# Patient Record
Sex: Male | Born: 1990
Health system: Southern US, Community
[De-identification: ages and names within clinical notes are randomized; demographics above are authoritative.]

## PROBLEM LIST (undated history)

## (undated) DIAGNOSIS — E7601 Hurler's syndrome: Secondary | ICD-10-CM

## (undated) DIAGNOSIS — R6250 Unspecified lack of expected normal physiological development in childhood: Secondary | ICD-10-CM

## (undated) HISTORY — PX: MASS EXCISION: SHX2000

## (undated) HISTORY — PX: APPENDECTOMY: SHX54

## (undated) HISTORY — PX: ADENOIDECTOMY: SUR15

## (undated) HISTORY — PX: TONSILLECTOMY: SUR1361

## (undated) HISTORY — PX: TYMPANOSTOMY TUBE PLACEMENT: SHX32

---
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
## 2012-01-10 ENCOUNTER — Emergency Department (HOSPITAL_BASED_OUTPATIENT_CLINIC_OR_DEPARTMENT_OTHER)
Admission: EM | Admit: 2012-01-10 | Discharge: 2012-01-10 | Disposition: A | Discharge status: Other Acute Inpatient Hospital | Payer: Medicaid Other | Attending: Emergency Medicine | Admitting: Emergency Medicine

## 2012-01-10 ENCOUNTER — Encounter (HOSPITAL_BASED_OUTPATIENT_CLINIC_OR_DEPARTMENT_OTHER): Payer: Self-pay

## 2012-01-10 ENCOUNTER — Emergency Department (HOSPITAL_BASED_OUTPATIENT_CLINIC_OR_DEPARTMENT_OTHER): Payer: Medicaid Other

## 2012-01-10 DIAGNOSIS — J45909 Unspecified asthma, uncomplicated: Secondary | ICD-10-CM | POA: Insufficient documentation

## 2012-01-10 DIAGNOSIS — J189 Pneumonia, unspecified organism: Secondary | ICD-10-CM | POA: Insufficient documentation

## 2012-01-10 DIAGNOSIS — Z9089 Acquired absence of other organs: Secondary | ICD-10-CM | POA: Insufficient documentation

## 2012-01-10 LAB — URINALYSIS, ROUTINE W REFLEX MICROSCOPIC
Glucose, UA: NEGATIVE mg/dL
Leukocytes, UA: NEGATIVE
Nitrite: NEGATIVE
Specific Gravity, Urine: 1.034 — ABNORMAL HIGH (ref 1.005–1.030)
Urobilinogen, UA: 0.2 mg/dL (ref 0.0–1.0)
pH: 5.5 (ref 5.0–8.0)

## 2012-01-10 LAB — CBC WITH DIFFERENTIAL/PLATELET
Basophils Relative: 0 % (ref 0–1)
Eosinophils Absolute: 0.3 10*3/uL (ref 0.0–0.7)
Hemoglobin: 16.3 g/dL (ref 13.0–17.0)
Lymphs Abs: 2.1 10*3/uL (ref 0.7–4.0)
MCH: 27.9 pg (ref 26.0–34.0)
MCHC: 34.9 g/dL (ref 30.0–36.0)
MCV: 80 fL (ref 78.0–100.0)
Neutro Abs: 12.3 10*3/uL — ABNORMAL HIGH (ref 1.7–7.7)
Platelets: 227 10*3/uL (ref 150–400)
RDW: 14.1 % (ref 11.5–15.5)
WBC: 16.3 10*3/uL — ABNORMAL HIGH (ref 4.0–10.5)

## 2012-01-10 LAB — COMPREHENSIVE METABOLIC PANEL
ALT: 31 U/L (ref 0–53)
Albumin: 4.3 g/dL (ref 3.5–5.2)
BUN: 14 mg/dL (ref 6–23)
Calcium: 9.4 mg/dL (ref 8.4–10.5)
Creatinine, Ser: 0.9 mg/dL (ref 0.50–1.35)
Potassium: 3.6 mEq/L (ref 3.5–5.1)
Total Protein: 8.2 g/dL (ref 6.0–8.3)

## 2012-01-10 LAB — URINE MICROSCOPIC-ADD ON

## 2012-01-10 MED ORDER — SODIUM CHLORIDE 0.9 % IV BOLUS (SEPSIS)
500.0000 mL | Freq: Once | INTRAVENOUS | Status: AC
  Administered 2012-01-10: 500 mL via INTRAVENOUS

## 2012-01-10 MED ORDER — LEVOFLOXACIN IN D5W 500 MG/100ML IV SOLN
500.0000 mg | INTRAVENOUS | Status: DC
  Filled 2012-01-10: qty 100

## 2012-01-10 MED ORDER — MOXIFLOXACIN HCL IN NACL 400 MG/250ML IV SOLN
400.0000 mg | Freq: Once | INTRAVENOUS | Status: AC
  Administered 2012-01-10 (×2): 400 mg via INTRAVENOUS
  Filled 2012-01-10: qty 250

## 2012-01-10 NOTE — ED Notes (Signed)
Patient transported to X-ray 

## 2012-01-10 NOTE — ED Notes (Signed)
Call placed to [facility] hospitalist  Pt accepted by [facility]

## 2012-01-10 NOTE — ED Provider Notes (Signed)
History     CSN: [id number]  Arrival date & time 01/10/12  4132   First MD Initiated Contact with Patient 01/10/12 1913      Chief Complaint  Patient presents with  . Emesis  . Abdominal Pain    (Consider location/radiation/quality/duration/timing/severity/associated sxs/prior treatment) HPI Pt p/w 1 day of frontal HA, rhinorrhea and congestion, cough, central abdominal pain and vomiting x 4. Pt is poor historian due to DD and history is mostly from mother. No fever, chills, diarrhea.  Past Medical History  Diagnosis Date  . Asthma   . Hurler syndrome   . Hearing loss   . Developmental delay     Past Surgical History  Procedure Date  . Adenoidectomy   . Tonsillectomy   . Appendectomy   . Mass excision   . Tympanostomy tube placement     No family history on file.  History  Substance Use Topics  . Smoking status: Never Smoker   . Smokeless tobacco: Never Used  . Alcohol Use: No      Review of Systems  Constitutional: Negative for fever and chills.  HENT: Positive for congestion, rhinorrhea and sinus pressure. Negative for neck stiffness.   Respiratory: Positive for cough. Negative for shortness of breath and wheezing.   Cardiovascular: Negative for chest pain and leg swelling.  Gastrointestinal: Positive for nausea, vomiting and abdominal pain. Negative for diarrhea and constipation.  Neurological: Positive for headaches. Negative for weakness and numbness.    Allergies  Nsaids; Rocephin; and Codeine  Home Medications  No current outpatient prescriptions on file.  BP 126/72  Pulse 112  Temp 98.2 F (36.8 C) (Oral)  Resp 20  Wt 160 lb (72.576 kg)  SpO2 98%  Physical Exam  Nursing note and vitals reviewed. Constitutional: He appears well-developed and well-nourished. No distress.  HENT:  Head: Normocephalic and atraumatic.  Mouth/Throat: Oropharynx is clear and moist.       Mild TTP over frontal sinuses. + Nasal congestion  Eyes: EOM are  normal. Pupils are equal, round, and reactive to light.  Neck: Normal range of motion. Neck supple.  Cardiovascular: Normal rate and regular rhythm.   Pulmonary/Chest: Effort normal and breath sounds normal. No respiratory distress. He has no wheezes. He has no rales.  Abdominal: Soft. Bowel sounds are normal. He exhibits distension. There is no tenderness. There is no rebound and no guarding.  Musculoskeletal: Normal range of motion. He exhibits no edema and no tenderness.  Neurological: He is alert.       Move all ext without deficit, sensation grossly intact  Skin: Skin is warm and dry. No rash noted. No erythema.  Psychiatric: He has a normal mood and affect. His behavior is normal.    ED Course  Procedures (including critical care time)  Labs Reviewed  CBC WITH DIFFERENTIAL - Abnormal; Notable for the following:    WBC 16.3 (*)     RBC 5.84 (*)     Neutro Abs 12.3 (*)     Monocytes Absolute 1.7 (*)     All other components within normal limits  URINALYSIS, ROUTINE W REFLEX MICROSCOPIC - Abnormal; Notable for the following:    Specific Gravity, Urine 1.034 (*)     Hgb urine dipstick TRACE (*)     Bilirubin Urine SMALL (*)     Ketones, ur 15 (*)     All other components within normal limits  COMPREHENSIVE METABOLIC PANEL  URINE MICROSCOPIC-ADD ON  CULTURE, BLOOD (SINGLE)   Dg  Abd Acute W/chest  01/10/2012  *RADIOLOGY REPORT*  Clinical Data: Abdominal pain, cough and vomiting.  ACUTE ABDOMEN SERIES (ABDOMEN 2 VIEW & CHEST 1 VIEW)  Comparison: Chest x-ray dated 03/04/2010  Findings: Superimposed on some mild prominence of bronchi is additional opacity at both lung bases since the prior chest x-ray reflecting either atelectasis or potentially component of infiltrates.  No edema or pleural fluid.  Heart size is normal.  Abdominal films show no evidence of bowel obstruction or ileus.  No abnormal calcifications are seen.  There is a mild leftward convex scoliosis of the lumbar spine  and some compensatory curvature of the thoracic spine.  IMPRESSION: Bibasilar atelectasis versus infiltrates in the chest.  Original Report Authenticated By: Reola Calkins, M.D.     1. Community acquired pneumonia       MDM  Discussed with Dr Darleene Cleaver at [facility] per pt's mother's request. Will accept in tranfer        Loren Racer, MD 01/10/12 2117

## 2012-01-10 NOTE — ED Notes (Signed)
Vomited x 4 today-cough, sore throat

## 2012-01-10 NOTE — ED Notes (Addendum)
Pt c/o abd pain when asked pain location

## 2012-01-17 LAB — CULTURE, BLOOD (SINGLE): Culture: NO GROWTH

## 2012-12-12 ENCOUNTER — Encounter (HOSPITAL_BASED_OUTPATIENT_CLINIC_OR_DEPARTMENT_OTHER): Payer: Self-pay | Admitting: *Deleted

## 2012-12-12 ENCOUNTER — Emergency Department (HOSPITAL_BASED_OUTPATIENT_CLINIC_OR_DEPARTMENT_OTHER)
Admission: EM | Admit: 2012-12-12 | Discharge: 2012-12-12 | Disposition: A | Discharge status: Home / Self Care | Payer: Medicaid Other | Attending: Emergency Medicine | Admitting: Emergency Medicine

## 2012-12-12 DIAGNOSIS — J3489 Other specified disorders of nose and nasal sinuses: Secondary | ICD-10-CM | POA: Insufficient documentation

## 2012-12-12 DIAGNOSIS — J45909 Unspecified asthma, uncomplicated: Secondary | ICD-10-CM | POA: Insufficient documentation

## 2012-12-12 DIAGNOSIS — H9190 Unspecified hearing loss, unspecified ear: Secondary | ICD-10-CM | POA: Insufficient documentation

## 2012-12-12 DIAGNOSIS — J029 Acute pharyngitis, unspecified: Secondary | ICD-10-CM | POA: Insufficient documentation

## 2012-12-12 DIAGNOSIS — B349 Viral infection, unspecified: Secondary | ICD-10-CM

## 2012-12-12 DIAGNOSIS — R509 Fever, unspecified: Secondary | ICD-10-CM | POA: Insufficient documentation

## 2012-12-12 DIAGNOSIS — R6250 Unspecified lack of expected normal physiological development in childhood: Secondary | ICD-10-CM | POA: Insufficient documentation

## 2012-12-12 DIAGNOSIS — Z862 Personal history of diseases of the blood and blood-forming organs and certain disorders involving the immune mechanism: Secondary | ICD-10-CM | POA: Insufficient documentation

## 2012-12-12 DIAGNOSIS — Z8639 Personal history of other endocrine, nutritional and metabolic disease: Secondary | ICD-10-CM | POA: Insufficient documentation

## 2012-12-12 DIAGNOSIS — Z8659 Personal history of other mental and behavioral disorders: Secondary | ICD-10-CM | POA: Insufficient documentation

## 2012-12-12 DIAGNOSIS — B9789 Other viral agents as the cause of diseases classified elsewhere: Secondary | ICD-10-CM | POA: Insufficient documentation

## 2012-12-12 DIAGNOSIS — R112 Nausea with vomiting, unspecified: Secondary | ICD-10-CM | POA: Insufficient documentation

## 2012-12-12 MED ORDER — ONDANSETRON HCL 4 MG PO TABS
4.0000 mg | ORAL_TABLET | Freq: Four times a day (QID) | ORAL | Status: DC
Start: 2012-12-12 — End: 2013-09-16

## 2012-12-12 MED ORDER — ONDANSETRON 8 MG PO TBDP
8.0000 mg | ORAL_TABLET | Freq: Once | ORAL | Status: AC
  Administered 2012-12-12: 8 mg via ORAL
  Filled 2012-12-12 (×2): qty 1

## 2012-12-12 NOTE — ED Notes (Signed)
Fever sore throat has intermittent vomiting

## 2012-12-12 NOTE — ED Provider Notes (Signed)
History     CSN: [id number]  Arrival date & time 12/12/12  1814   First MD Initiated Contact with Patient 12/12/12 1820      No chief complaint on file.   (Consider location/radiation/quality/duration/timing/severity/associated sxs/prior treatment) HPI Comments: Patient presents to ER for evaluation of fever, sinus congestion, sore throat with vomiting which began earlier today. He denies abdominal pain. There has been no diarrhea. Patient is not coughing.   Past Medical History  Diagnosis Date  . Asthma   . Hurler syndrome   . Hearing loss   . Developmental delay     Past Surgical History  Procedure Laterality Date  . Adenoidectomy    . Tonsillectomy    . Appendectomy    . Mass excision    . Tympanostomy tube placement      No family history on file.  History  Substance Use Topics  . Smoking status: Never Smoker   . Smokeless tobacco: Never Used  . Alcohol Use: No      Review of Systems  Constitutional: Positive for fever.  HENT: Positive for congestion and sore throat.   Gastrointestinal: Positive for nausea and vomiting.  All other systems reviewed and are negative.    Allergies  Nsaids; Rocephin; and Codeine  Home Medications  No current outpatient prescriptions on file.  There were no vitals taken for this visit.  Physical Exam  Constitutional: He is oriented to person, place, and time. He appears well-developed and well-nourished. No distress.  HENT:  Head: Normocephalic and atraumatic.  Right Ear: Hearing normal.  Left Ear: Hearing normal.  Nose: Nose normal.  Mouth/Throat: Mucous membranes are normal. Posterior oropharyngeal erythema present. No tonsillar abscesses.  Eyes: Conjunctivae and EOM are normal. Pupils are equal, round, and reactive to light.  Neck: Normal range of motion. Neck supple.  Cardiovascular: Regular rhythm, S1 normal and S2 normal.  Exam reveals no gallop and no friction rub.   No murmur heard. Pulmonary/Chest:  Effort normal and breath sounds normal. No respiratory distress. He exhibits no tenderness.  Abdominal: Soft. Normal appearance and bowel sounds are normal. There is no hepatosplenomegaly. There is no tenderness. There is no rebound, no guarding, no tenderness at McBurney's point and negative Murphy's sign. No hernia.  Musculoskeletal: Normal range of motion.  Neurological: He is alert and oriented to person, place, and time. He has normal strength. No cranial nerve deficit or sensory deficit. Coordination normal. GCS eye subscore is 4. GCS verbal subscore is 5. GCS motor subscore is 6.  Skin: Skin is warm, dry and intact. No rash noted. No cyanosis.  Psychiatric: He has a normal mood and affect. His speech is normal and behavior is normal. Thought content normal.    ED Course  Procedures (including critical care time)  Labs Reviewed  RAPID STREP SCREEN   No results found.   Diagnosis: Viral syndrome    MDM  Patient presents with nasal congestion, sore throat. Mother reports fever, no fever here. He reportedly had vomiting earlier, no vomiting since she was given Zofran care. Tolerating by mouth. Strep negative, culture will be sent. Patient looks well, no abdominal pain. Abdominal exam is soft and nontender. Continue symptomatic treatment for any further vomiting, followup as needed.        Gilda Crease, MD 12/12/12 1943

## 2012-12-14 LAB — CULTURE, GROUP A STREP

## 2013-09-16 ENCOUNTER — Emergency Department (HOSPITAL_BASED_OUTPATIENT_CLINIC_OR_DEPARTMENT_OTHER)
Admission: EM | Admit: 2013-09-16 | Discharge: 2013-09-16 | Disposition: A | Discharge status: Home / Self Care | Payer: Medicaid Other | Attending: Emergency Medicine | Admitting: Emergency Medicine

## 2013-09-16 ENCOUNTER — Encounter (HOSPITAL_BASED_OUTPATIENT_CLINIC_OR_DEPARTMENT_OTHER): Payer: Self-pay | Admitting: Emergency Medicine

## 2013-09-16 ENCOUNTER — Emergency Department (HOSPITAL_BASED_OUTPATIENT_CLINIC_OR_DEPARTMENT_OTHER): Payer: Medicaid Other

## 2013-09-16 DIAGNOSIS — Z862 Personal history of diseases of the blood and blood-forming organs and certain disorders involving the immune mechanism: Secondary | ICD-10-CM | POA: Insufficient documentation

## 2013-09-16 DIAGNOSIS — R109 Unspecified abdominal pain: Secondary | ICD-10-CM

## 2013-09-16 DIAGNOSIS — Z8639 Personal history of other endocrine, nutritional and metabolic disease: Secondary | ICD-10-CM | POA: Insufficient documentation

## 2013-09-16 DIAGNOSIS — J45909 Unspecified asthma, uncomplicated: Secondary | ICD-10-CM | POA: Insufficient documentation

## 2013-09-16 DIAGNOSIS — R000 Tachycardia, unspecified: Secondary | ICD-10-CM | POA: Insufficient documentation

## 2013-09-16 DIAGNOSIS — R112 Nausea with vomiting, unspecified: Secondary | ICD-10-CM | POA: Insufficient documentation

## 2013-09-16 DIAGNOSIS — Z79899 Other long term (current) drug therapy: Secondary | ICD-10-CM | POA: Insufficient documentation

## 2013-09-16 DIAGNOSIS — Z9089 Acquired absence of other organs: Secondary | ICD-10-CM | POA: Insufficient documentation

## 2013-09-16 DIAGNOSIS — R197 Diarrhea, unspecified: Secondary | ICD-10-CM | POA: Insufficient documentation

## 2013-09-16 DIAGNOSIS — Z8669 Personal history of other diseases of the nervous system and sense organs: Secondary | ICD-10-CM | POA: Insufficient documentation

## 2013-09-16 DIAGNOSIS — R1084 Generalized abdominal pain: Secondary | ICD-10-CM | POA: Insufficient documentation

## 2013-09-16 LAB — CBC WITH DIFFERENTIAL/PLATELET
BASOS PCT: 0 % (ref 0–1)
Basophils Absolute: 0 10*3/uL (ref 0.0–0.1)
EOS ABS: 0.2 10*3/uL (ref 0.0–0.7)
Eosinophils Relative: 1 % (ref 0–5)
HCT: 54.3 % — ABNORMAL HIGH (ref 39.0–52.0)
HEMOGLOBIN: 19 g/dL — AB (ref 13.0–17.0)
LYMPHS ABS: 0.7 10*3/uL (ref 0.7–4.0)
LYMPHS PCT: 5 % — AB (ref 12–46)
MCH: 28.6 pg (ref 26.0–34.0)
MCHC: 35 g/dL (ref 30.0–36.0)
MCV: 81.8 fL (ref 78.0–100.0)
MONO ABS: 1.5 10*3/uL — AB (ref 0.1–1.0)
Monocytes Relative: 11 % (ref 3–12)
NEUTROS ABS: 11.8 10*3/uL — AB (ref 1.7–7.7)
NEUTROS PCT: 83 % — AB (ref 43–77)
Platelets: 223 10*3/uL (ref 150–400)
RBC: 6.64 MIL/uL — ABNORMAL HIGH (ref 4.22–5.81)
RDW: 14.8 % (ref 11.5–15.5)
WBC: 14.3 10*3/uL — AB (ref 4.0–10.5)

## 2013-09-16 LAB — COMPREHENSIVE METABOLIC PANEL
ALT: 38 U/L (ref 0–53)
AST: 25 U/L (ref 0–37)
Albumin: 4.6 g/dL (ref 3.5–5.2)
Alkaline Phosphatase: 80 U/L (ref 39–117)
BUN: 12 mg/dL (ref 6–23)
CO2: 23 mEq/L (ref 19–32)
Calcium: 9.5 mg/dL (ref 8.4–10.5)
Chloride: 104 mEq/L (ref 96–112)
Creatinine, Ser: 0.8 mg/dL (ref 0.50–1.35)
GFR calc Af Amer: >90 mL/min (ref >90)
GFR calc non Af Amer: >90 mL/min (ref >90)
Glucose, Bld: 99 mg/dL (ref 70–99)
Potassium: 4 mEq/L (ref 3.7–5.3)
Sodium: 142 mEq/L (ref 137–147)
Total Bilirubin: 0.8 mg/dL (ref 0.3–1.2)
Total Protein: 8.5 g/dL — ABNORMAL HIGH (ref 6.0–8.3)

## 2013-09-16 LAB — URINALYSIS, ROUTINE W REFLEX MICROSCOPIC
Bilirubin Urine: NEGATIVE
Glucose, UA: NEGATIVE mg/dL
KETONES UR: 15 mg/dL — AB
LEUKOCYTES UA: NEGATIVE
NITRITE: NEGATIVE
PH: 5 (ref 5.0–8.0)
PROTEIN: NEGATIVE mg/dL
Specific Gravity, Urine: 1.018 (ref 1.005–1.030)
Urobilinogen, UA: 0.2 mg/dL (ref 0.0–1.0)

## 2013-09-16 LAB — URINE MICROSCOPIC-ADD ON

## 2013-09-16 LAB — LIPASE, BLOOD: LIPASE: 26 U/L (ref 11–59)

## 2013-09-16 MED ORDER — ONDANSETRON 4 MG PO TBDP: 4.0000 mg | ORAL_TABLET | Freq: Three times a day (TID) | ORAL | Status: DC | PRN

## 2013-09-16 MED ORDER — ONDANSETRON HCL 4 MG/2ML IJ SOLN
INTRAMUSCULAR | Status: AC
  Filled 2013-09-16: qty 2

## 2013-09-16 MED ORDER — ONDANSETRON HCL 4 MG/2ML IJ SOLN
4.0000 mg | Freq: Once | INTRAMUSCULAR | Status: AC
  Administered 2013-09-16: 4 mg via INTRAVENOUS

## 2013-09-16 MED ORDER — FENTANYL CITRATE 0.05 MG/ML IJ SOLN
50.0000 ug | Freq: Once | INTRAMUSCULAR | Status: AC
Start: 2013-09-16 — End: 2013-09-16
  Administered 2013-09-16: 50 ug via INTRAVENOUS
  Filled 2013-09-16: qty 2

## 2013-09-16 MED ORDER — SODIUM CHLORIDE 0.9 % IV SOLN
1000.0000 mL | Freq: Once | INTRAVENOUS | Status: AC
  Administered 2013-09-16: 1000 mL via INTRAVENOUS

## 2013-09-16 MED ORDER — ONDANSETRON HCL 4 MG/2ML IJ SOLN
4.0000 mg | Freq: Once | INTRAMUSCULAR | Status: AC
  Administered 2013-09-16: 4 mg via INTRAVENOUS
  Filled 2013-09-16: qty 2

## 2013-09-16 NOTE — ED Notes (Signed)
Pt with abd pain, n/v/d today.  Denies fever.

## 2013-09-16 NOTE — Discharge Instructions (Signed)
As discussed, your evaluation today has been largely reassuring.  But, it is important that you monitor your condition carefully, and do not hesitate to return to the ED if you develop new, or concerning changes in your condition. ° °Otherwise, please follow-up with your physician for appropriate ongoing care. ° ° ° °Abdominal Pain, Adult °Many things can cause abdominal pain. Usually, abdominal pain is not caused by a disease and will improve without treatment. It can often be observed and treated at home. Your health care provider will do a physical exam and possibly order blood tests and X-rays to help determine the seriousness of your pain. However, in many cases, more time must pass before a clear cause of the pain can be found. Before that point, your health care provider may not know if you need more testing or further treatment. °HOME CARE INSTRUCTIONS  °Monitor your abdominal pain for any changes. The following actions may help to alleviate any discomfort you are experiencing: °· Only take over-the-counter or prescription medicines as directed by your health care provider. °· Do not take laxatives unless directed to do so by your health care provider. °· Try a clear liquid diet (broth, tea, or water) as directed by your health care provider. Slowly move to a bland diet as tolerated. °SEEK MEDICAL CARE IF: °· You have unexplained abdominal pain. °· You have abdominal pain associated with nausea or diarrhea. °· You have pain when you urinate or have a bowel movement. °· You experience abdominal pain that wakes you in the night. °· You have abdominal pain that is worsened or improved by eating food. °· You have abdominal pain that is worsened with eating fatty foods. °SEEK IMMEDIATE MEDICAL CARE IF:  °· Your pain does not go away within 2 hours. °· You have a fever. °· You keep throwing up (vomiting). °· Your pain is felt only in portions of the abdomen, such as the right side or the left lower portion of the  abdomen. °· You pass bloody or black tarry stools. °MAKE SURE YOU: °· Understand these instructions.   °· Will watch your condition.   °· Will get help right away if you are not doing well or get worse.   °Document Released: 03/23/2005 Document Revised: 04/03/2013 Document Reviewed: 02/20/2013 °[employer]® Patient Information ©2014 [employer]. ° °

## 2013-09-16 NOTE — ED Notes (Signed)
Pt vomited green liquid material on gown and sheets, pt cleaned up and md notified, pt states that it happened all of the sudden, he denies nausea at this time

## 2013-09-16 NOTE — ED Provider Notes (Signed)
CSN: [id number]     Arrival date & time 09/16/13  4098 History   This chart was scribed for Gerhard Munch, MD by Manuela Schwartz, ED scribe. This patient was seen in room MH03/MH03 and the patient's care was started at 1822.  Chief Complaint  Patient presents with  . Abdominal Pain   The history is provided by the patient. No language interpreter was used.   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 He felt fine last PM and all of yesterday. He also reports x2 episodes of diarrhea today. He tried taking dramamine w/minimal relief. He denies any fever, cough, diarrhea, skin rash or swelling. No hx kidney problems.  He had abdominal surgery in 2007 to remove a mass on his stomach and his appendix.  He is allergic to codeine and rocephin  Past Medical History  Diagnosis Date  . Asthma   . Hurler syndrome   . Hearing loss   . Developmental delay    Past Surgical History  Procedure Laterality Date  . Adenoidectomy    . Tonsillectomy    . Appendectomy    . Mass excision    . Tympanostomy tube placement     No family history on file. History  Substance Use Topics  . Smoking status: Never Smoker   . Smokeless tobacco: Never Used  . Alcohol Use: No    Review of Systems  Constitutional: Negative for fever and chills.  Respiratory: Negative for cough and shortness of breath.   Cardiovascular: Negative for chest pain.  Gastrointestinal: Positive for nausea, vomiting, abdominal pain and diarrhea.  Musculoskeletal: Negative for back pain.  Skin: Negative for rash.  All other systems reviewed and are negative.    Allergies  Nsaids; Rocephin; and Codeine  Home Medications   Current Outpatient Rx  Name  Route  Sig  Dispense  Refill  . albuterol-ipratropium (COMBIVENT) 18-103 MCG/ACT inhaler   Inhalation    Inhale 2 puffs into the lungs every 6 (six) hours as needed for wheezing.         . ondansetron (ZOFRAN) 4 MG tablet   Oral   Take 1 tablet (4 mg total) by mouth every 6 (six) hours.   12 tablet   0   . ranitidine (ZANTAC) 150 MG capsule   Oral   Take 150 mg by mouth 2 (two) times daily.          Triage Vitals: BP 124/78  Pulse 115  Temp(Src) 98.8 F (37.1 C) (Oral)  Resp 19  Wt 160 lb 11.2 oz (72.893 kg)  SpO2 99%  Physical Exam  Nursing note and vitals reviewed. Constitutional: He is oriented to person, place, and time. He appears well-developed and well-nourished. No distress.  HENT:  Head: Normocephalic and atraumatic.  Eyes: Conjunctivae are normal. Right eye exhibits no discharge. Left eye exhibits no discharge.  Neck: Normal range of motion.  Cardiovascular: Normal rate, regular rhythm and normal heart sounds.   No murmur heard. Pulmonary/Chest: Effort normal and breath sounds normal. No respiratory distress. He has no wheezes. He has no rales.  Abdominal: Soft. He exhibits no distension. There is tenderness.  Diffuse abdominal tenderness to palpation.   Musculoskeletal: Normal range of motion. He exhibits no edema.  Neurological: He is alert and oriented to person, place, and time.  Skin: Skin is warm and dry.  Psychiatric: He has a normal mood and affect. Thought content normal.   ED Course  Procedures (including critical care time) DIAGNOSTIC STUDIES: Oxygen Saturation is 99% on room air, normal by my interpretation.    COORDINATION OF CARE: At 720 PM Discussed treatment plan with patient which includes IV fluids, nausea medicine, fentanyl, abdominal CT, blood work. Patient agrees.   2230- Patient did have one episode of emesis.  However, he states that he feels better, and his mother reports some improvement in his condition. He has a PMD with whom he will f/u tomorrow.  Labs Review Labs Reviewed  CBC WITH DIFFERENTIAL - Abnormal; Notable for the  following:    WBC 14.3 (*)    RBC 6.64 (*)    Hemoglobin 19.0 (*)    HCT 54.3 (*)    Neutrophils Relative % 83 (*)    Neutro Abs 11.8 (*)    Lymphocytes Relative 5 (*)    Monocytes Absolute 1.5 (*)    All other components within normal limits  COMPREHENSIVE METABOLIC PANEL - Abnormal; Notable for the following:    Total Protein 8.5 (*)    All other components within normal limits  URINALYSIS, ROUTINE W REFLEX MICROSCOPIC - Abnormal; Notable for the following:    Hgb urine dipstick TRACE (*)    Ketones, ur 15 (*)    All other components within normal limits  LIPASE, BLOOD  URINE MICROSCOPIC-ADD ON   Imaging Review Ct Abdomen Pelvis Wo Contrast  09/16/2013   CLINICAL DATA:  Mid abdominal pain, nausea and vomiting  EXAM: CT ABDOMEN AND PELVIS WITHOUT CONTRAST  TECHNIQUE: Multidetector CT imaging of the abdomen and pelvis was performed following the standard protocol without intravenous contrast.  COMPARISON:  No prior study available for comparison.  FINDINGS: Lung bases are unremarkable. Sagittal images of the spine are unremarkable. Study is limited without IV or oral contrast. Unenhanced liver shows no biliary ductal dilatation. Unenhanced pancreas, spleen and adrenals are unremarkable. Unenhanced kidneys shows no nephrolithiasis. No hydronephrosis or hydroureter.  There is no aortic aneurysm. No calcified ureteral calculi are noted. No small bowel obstruction. Amount scattered nonspecific mesenteric lymph nodes are noted the largest in right lower quadrant mesentery measures 8 mm not pathologic by size criteria.  The terminal ileum is unremarkable. No pericecal inflammation. The urinary bladder is unremarkable. Some stool noted in rectosigmoid colon. Prostate gland and seminal vesicles are unremarkable. No ascites or free air.  IMPRESSION: 1. Limited study without IV or oral contrast. No small bowel or colonic obstruction. No ascites or free air. Nonspecific mesenteric lymph nodes are not  pathologic by size criteria. 2. No hydronephrosis or hydroureter. No nephrolithiasis. No calcified ureteral calculi. 3. No calcified calculi are noted within urinary bladder.   Electronically Signed   By: Natasha MeadLiviu  Pop M.D.   On: 09/16/2013 20:34      MDM   Final diagnoses:  Abdominal pain    This young male presents with abdominal pain, nausea, vomiting or on exam he is awake, alert, with a tender abdomen, there was no evidence of peritonitis.  Patient is mildly tachycardic, but otherwise hemodynamically stable.  With patient's history of prior abdominal surgery, including excision of mass, and his genetic disorder imaging was performed.  Imaging was reassuring, labs were largely reassuring, and the patient improved here with 2 L of fluid, analgesics, antiemetics. Patient did have one episode of emesis, but was in no distress, with no evidence of peritonitis or systemic infection. Patient was discharged in  stable condition to followup with primary care.  I personally performed the services described in this documentation, which was scribed in my presence. The recorded information has been reviewed and is accurate.      Gerhard Munch, MD 09/16/13 2342

## 2013-12-12 ENCOUNTER — Emergency Department (HOSPITAL_BASED_OUTPATIENT_CLINIC_OR_DEPARTMENT_OTHER)
Admission: EM | Admit: 2013-12-12 | Discharge: 2013-12-12 | Disposition: A | Discharge status: Home / Self Care | Payer: Medicaid Other | Attending: Emergency Medicine | Admitting: Emergency Medicine

## 2013-12-12 ENCOUNTER — Encounter (HOSPITAL_BASED_OUTPATIENT_CLINIC_OR_DEPARTMENT_OTHER): Payer: Self-pay | Admitting: Emergency Medicine

## 2013-12-12 DIAGNOSIS — J45909 Unspecified asthma, uncomplicated: Secondary | ICD-10-CM | POA: Insufficient documentation

## 2013-12-12 DIAGNOSIS — B349 Viral infection, unspecified: Secondary | ICD-10-CM

## 2013-12-12 DIAGNOSIS — B9789 Other viral agents as the cause of diseases classified elsewhere: Secondary | ICD-10-CM | POA: Insufficient documentation

## 2013-12-12 DIAGNOSIS — H9190 Unspecified hearing loss, unspecified ear: Secondary | ICD-10-CM | POA: Insufficient documentation

## 2013-12-12 DIAGNOSIS — Z8639 Personal history of other endocrine, nutritional and metabolic disease: Secondary | ICD-10-CM | POA: Insufficient documentation

## 2013-12-12 DIAGNOSIS — Z79899 Other long term (current) drug therapy: Secondary | ICD-10-CM | POA: Insufficient documentation

## 2013-12-12 DIAGNOSIS — Z862 Personal history of diseases of the blood and blood-forming organs and certain disorders involving the immune mechanism: Secondary | ICD-10-CM | POA: Insufficient documentation

## 2013-12-12 LAB — URINALYSIS, ROUTINE W REFLEX MICROSCOPIC
BILIRUBIN URINE: NEGATIVE
GLUCOSE, UA: NEGATIVE mg/dL
KETONES UR: NEGATIVE mg/dL
Leukocytes, UA: NEGATIVE
Nitrite: NEGATIVE
Protein, ur: NEGATIVE mg/dL
Specific Gravity, Urine: 1.021 (ref 1.005–1.030)
Urobilinogen, UA: 0.2 mg/dL (ref 0.0–1.0)
pH: 5 (ref 5.0–8.0)

## 2013-12-12 LAB — CBC WITH DIFFERENTIAL/PLATELET
Basophils Absolute: 0 10*3/uL (ref 0.0–0.1)
Basophils Relative: 0 % (ref 0–1)
EOS ABS: 0.1 10*3/uL (ref 0.0–0.7)
Eosinophils Relative: 1 % (ref 0–5)
HCT: 51.3 % (ref 39.0–52.0)
Hemoglobin: 17.6 g/dL — ABNORMAL HIGH (ref 13.0–17.0)
LYMPHS ABS: 2.3 10*3/uL (ref 0.7–4.0)
LYMPHS PCT: 23 % (ref 12–46)
MCH: 27.8 pg (ref 26.0–34.0)
MCHC: 34.3 g/dL (ref 30.0–36.0)
MCV: 80.9 fL (ref 78.0–100.0)
Monocytes Absolute: 1 10*3/uL (ref 0.1–1.0)
Monocytes Relative: 10 % (ref 3–12)
NEUTROS PCT: 66 % (ref 43–77)
Neutro Abs: 6.4 10*3/uL (ref 1.7–7.7)
PLATELETS: 248 10*3/uL (ref 150–400)
RBC: 6.34 MIL/uL — AB (ref 4.22–5.81)
RDW: 14.3 % (ref 11.5–15.5)
WBC: 9.7 10*3/uL (ref 4.0–10.5)

## 2013-12-12 LAB — BASIC METABOLIC PANEL
BUN: 9 mg/dL (ref 6–23)
CO2: 22 meq/L (ref 19–32)
Calcium: 9.5 mg/dL (ref 8.4–10.5)
Chloride: 105 mEq/L (ref 96–112)
Creatinine, Ser: 0.8 mg/dL (ref 0.50–1.35)
GFR calc Af Amer: >90 mL/min (ref >90)
GFR calc non Af Amer: >90 mL/min (ref >90)
GLUCOSE: 91 mg/dL (ref 70–99)
Potassium: 4 mEq/L (ref 3.7–5.3)
SODIUM: 140 meq/L (ref 137–147)

## 2013-12-12 LAB — URINE MICROSCOPIC-ADD ON

## 2013-12-12 MED ORDER — ONDANSETRON 4 MG PO TBDP: 4.0000 mg | ORAL_TABLET | Freq: Three times a day (TID) | ORAL | Status: DC | PRN

## 2013-12-12 MED ORDER — SODIUM CHLORIDE 0.9 % IV BOLUS (SEPSIS)
1000.0000 mL | Freq: Once | INTRAVENOUS | Status: AC
  Administered 2013-12-12: 1000 mL via INTRAVENOUS

## 2013-12-12 MED ORDER — ONDANSETRON HCL 4 MG/2ML IJ SOLN
4.0000 mg | Freq: Once | INTRAMUSCULAR | Status: AC
  Administered 2013-12-12: 4 mg via INTRAVENOUS
  Filled 2013-12-12: qty 2

## 2013-12-12 NOTE — ED Notes (Signed)
Abdominal pain since this am. Vomiting and diarrhea.

## 2013-12-12 NOTE — ED Provider Notes (Signed)
Medical screening examination/treatment/procedure(s) were performed by non-physician practitioner and as supervising physician I was immediately available for consultation/collaboration.   EKG Interpretation None       Sam Jacubowitz, MD 12/12/13 2355 

## 2013-12-12 NOTE — Discharge Instructions (Signed)
Take zofran as needed for nausea. Refer to attached documents for more information. Return to the ED with worsening or concerning symptoms.  °

## 2013-12-12 NOTE — ED Provider Notes (Signed)
CSN: [id number]     Arrival date & time 12/12/13  1816 History   First MD Initiated Contact with Patient 12/12/13 1901     Chief Complaint  Patient presents with  . Abdominal Pain     (Consider location/radiation/quality/duration/timing/severity/associated sxs/prior Treatment) HPI Comments: Patient is a 23 year old male who presents with abdominal pain since this morning. The pain is located in his general abdomen and does not radiate. The pain is described as cramping and moderate. The pain started gradually and progressively worsened since the onset. No alleviating/aggravating factors. The patient has tried nothing for symptoms without relief. Associated symptoms include nausea and vomiting twice today. Patient denies fever, headache, diarrhea, chest pain, SOB, dysuria, constipation. Last normal bowel movement was yesterday.   Past Medical History  Diagnosis Date  . Asthma   . Hurler syndrome   . Hearing loss   . Developmental delay    Past Surgical History  Procedure Laterality Date  . Adenoidectomy    . Tonsillectomy    . Appendectomy    . Mass excision    . Tympanostomy tube placement     No family history on file. History  Substance Use Topics  . Smoking status: Never Smoker   . Smokeless tobacco: Never Used  . Alcohol Use: No    Review of Systems  Constitutional: Negative for fever, chills and fatigue.  HENT: Negative for trouble swallowing.   Eyes: Negative for visual disturbance.  Respiratory: Negative for shortness of breath.   Cardiovascular: Negative for chest pain and palpitations.  Gastrointestinal: Positive for nausea, vomiting and abdominal pain. Negative for diarrhea.  Genitourinary: Negative for dysuria and difficulty urinating.  Musculoskeletal: Negative for arthralgias and neck pain.  Skin: Negative for color change.  Neurological: Negative for dizziness and weakness.  Psychiatric/Behavioral: Negative for dysphoric mood.      Allergies  Nsaids;  Rocephin; and Codeine  Home Medications   Prior to Admission medications   Medication Sig Start Date End Date Taking? Authorizing Provider  albuterol-ipratropium (COMBIVENT) 18-103 MCG/ACT inhaler Inhale 2 puffs into the lungs every 6 (six) hours as needed for wheezing.    Historical Provider, MD  ondansetron (ZOFRAN ODT) 4 MG disintegrating tablet Take 1 tablet (4 mg total) by mouth every 8 (eight) hours as needed for nausea or vomiting. 09/16/13   Gerhard Munchobert Lockwood, MD  ranitidine (ZANTAC) 150 MG capsule Take 150 mg by mouth 2 (two) times daily.    Historical Provider, MD   BP 140/80  Pulse 94  Temp(Src) 98.2 F (36.8 C) (Oral)  Resp 16  SpO2 98% Physical Exam  Nursing note and vitals reviewed. Constitutional: He is oriented to person, place, and time. He appears well-developed and well-nourished. No distress.  HENT:  Head: Normocephalic and atraumatic.  Eyes: Conjunctivae and EOM are normal.  Neck: Normal range of motion.  Cardiovascular: Normal rate and regular rhythm.  Exam reveals no gallop and no friction rub.   No murmur heard. Pulmonary/Chest: Effort normal and breath sounds normal. He has no wheezes. He has no rales. He exhibits no tenderness.  Abdominal: Soft. He exhibits no distension. There is tenderness. There is no rebound and no guarding.  Mild generalized tenderness to palpation. No focal tenderness or peritoneal signs.   Musculoskeletal: Normal range of motion.  Neurological: He is alert and oriented to person, place, and time. Coordination normal.  Speech is goal-oriented. Moves limbs without ataxia.   Skin: Skin is warm and dry.  Psychiatric: He has a normal  mood and affect. His behavior is normal.    ED Course  Procedures (including critical care time) Labs Review Labs Reviewed  URINALYSIS, ROUTINE W REFLEX MICROSCOPIC - Abnormal; Notable for the following:    Hgb urine dipstick SMALL (*)    All other components within normal limits  CBC WITH DIFFERENTIAL -  Abnormal; Notable for the following:    RBC 6.34 (*)    Hemoglobin 17.6 (*)    All other components within normal limits  URINE MICROSCOPIC-ADD ON  BASIC METABOLIC PANEL    Imaging Review No results found.   EKG Interpretation None      MDM   Final diagnoses:  Viral illness    7:24 PM Labs and urinalysis pending. Patient will have zofran for symptoms. Vitals stable and patient afebrile. Patient declines pain medication.   9:35 PM Labs and urinalysis unremarkable for acute changes. Patient likely has a viral illness and will be discharged with zofran. Patient instructed to follow up with PCP as needed.     Emilia BeckKaitlyn Szekalski, PA-C 12/12/13 2139

## 2015-09-14 IMAGING — CT CT ABD-PELV W/O CM
2 of 4 series · 16 of 46 positions shown, 18 images · non-contrast
Comparison: No prior study available for comparison.

CLINICAL DATA: Mid abdominal pain, nausea and vomiting

EXAM:
CT ABDOMEN AND PELVIS WITHOUT CONTRAST
TECHNIQUE: Multidetector CT imaging of the abdomen and pelvis was performed
following the standard protocol without intravenous contrast.

[Series 3: abd/pelvis 5.0 b31f · axial · 0.71mm/px · z∈[-416,+24]mm · 13 of 98 slices shown, 15 images]
[im 5/98  soft-tissue]
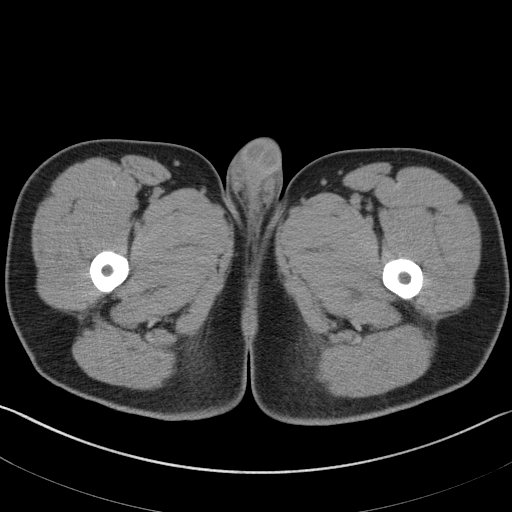
[im 5/98  bone]
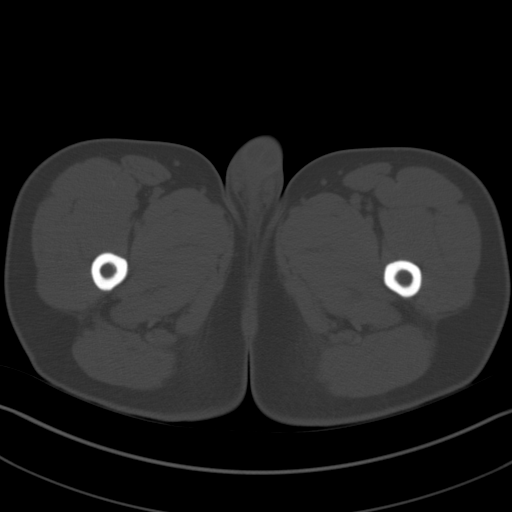
[im 13/98  soft-tissue]
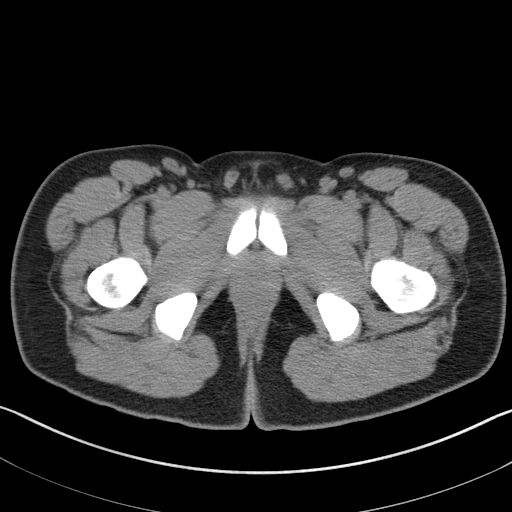
[im 21/98  soft-tissue]
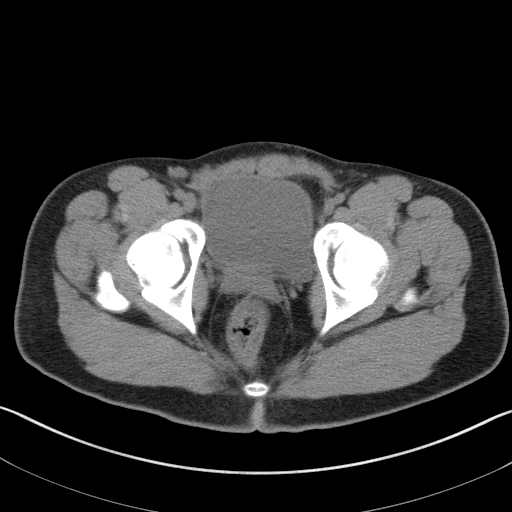
[im 29/98  soft-tissue]
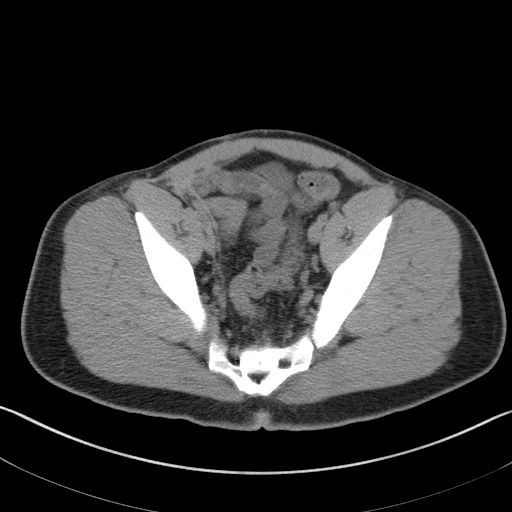
[im 33/98  soft-tissue]
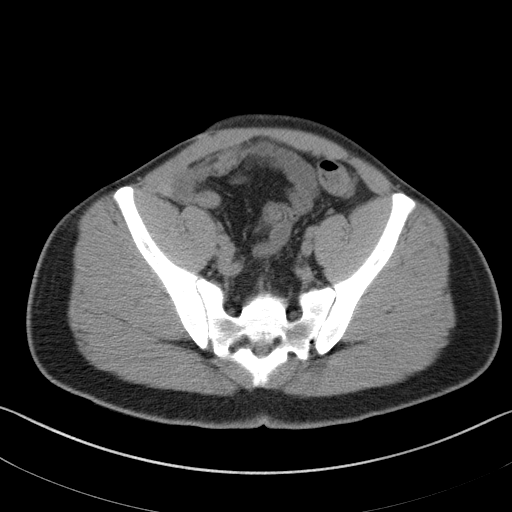
[im 41/98  soft-tissue]
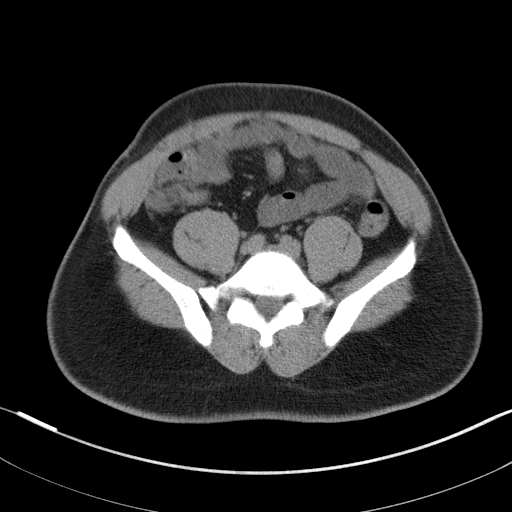
[im 49/98  soft-tissue]
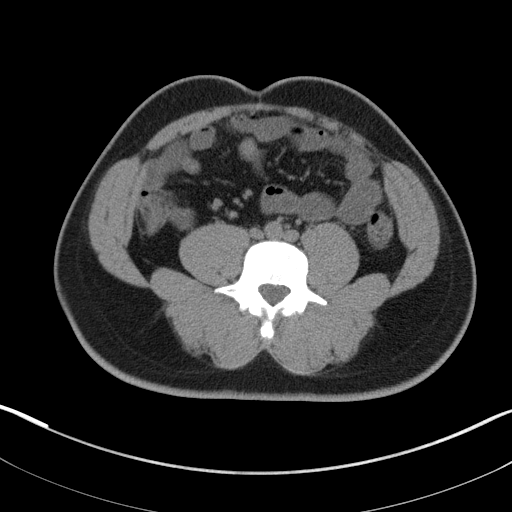
[im 57/98  soft-tissue]
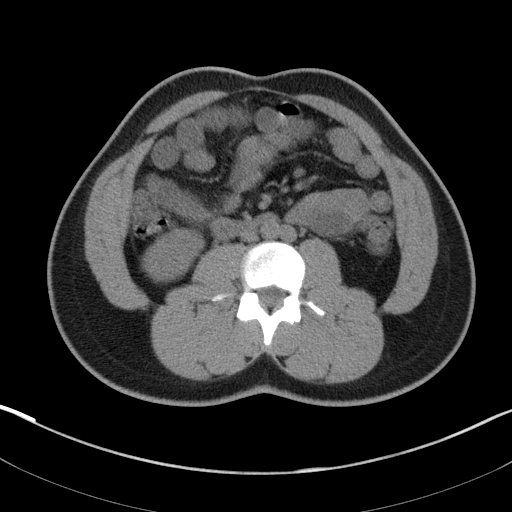
[im 65/98  soft-tissue]
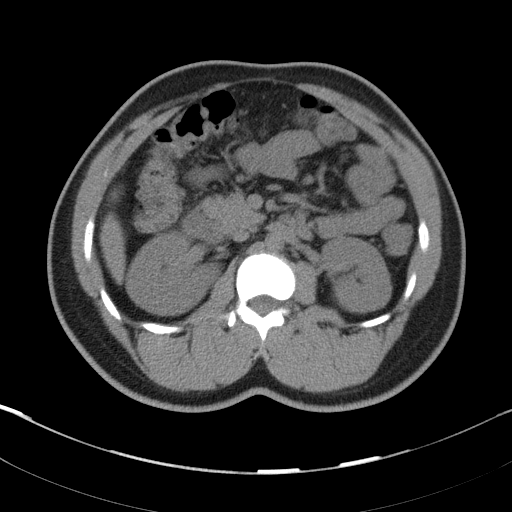
[im 65/98  bone]
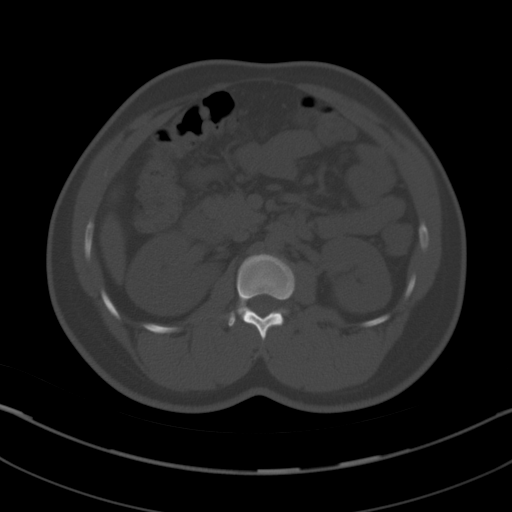
[im 69/98  soft-tissue]
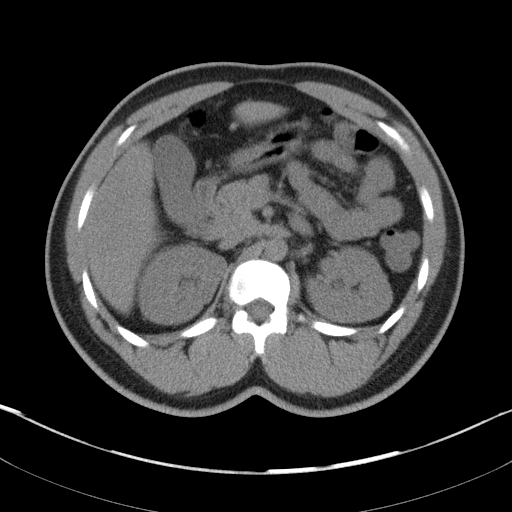
[im 77/98  soft-tissue]
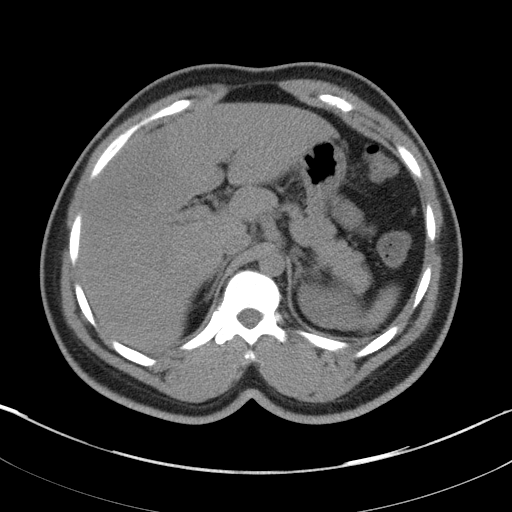
[im 85/98  soft-tissue]
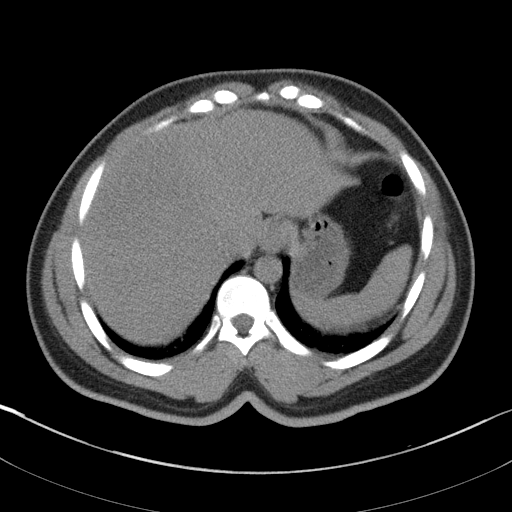
[im 93/98  soft-tissue]
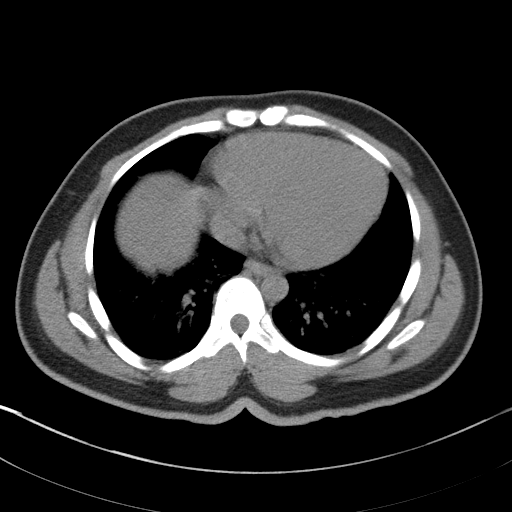

[Series 6: abd/pelvis 3.0 coronal · coronal · 0.72mm/px · 3 of 77 slices shown]
[im 26/77  soft-tissue]
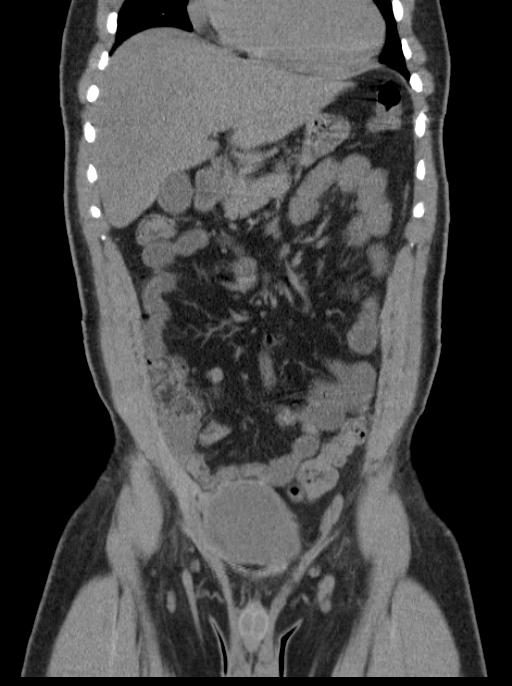
[im 34/77  soft-tissue]
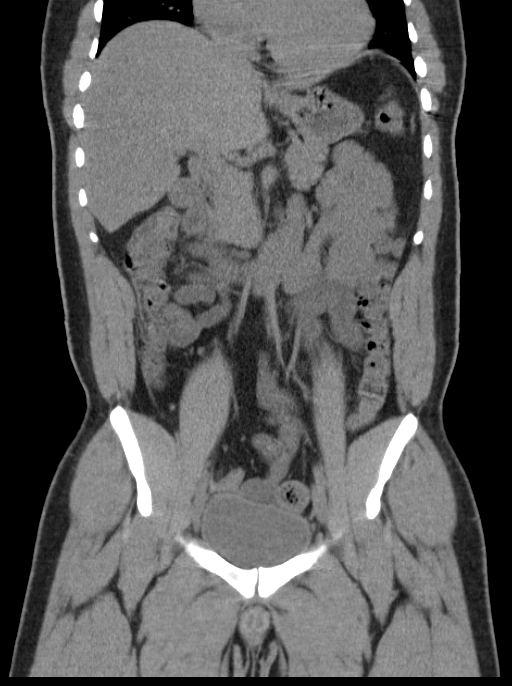
[im 43/77  soft-tissue]
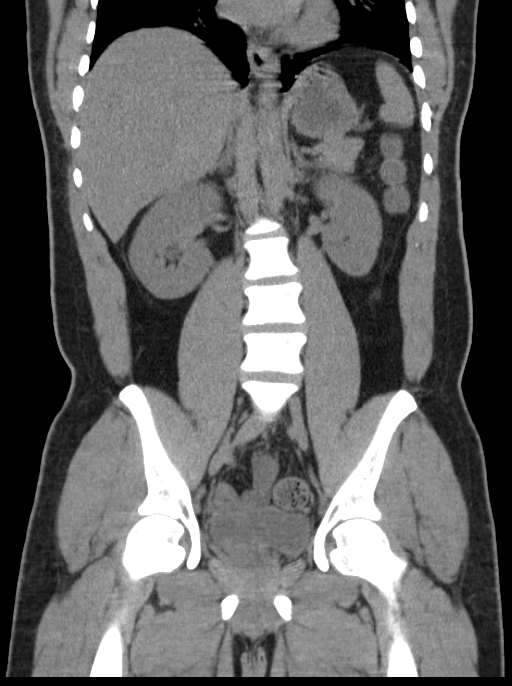

[16 of 46 positions shown; findings below may reference images not displayed]

FINDINGS: Lung bases are unremarkable. Sagittal images of the spine are
unremarkable. Study is limited without IV or oral contrast.
Unenhanced liver shows no biliary ductal dilatation. Unenhanced
pancreas, spleen and adrenals are unremarkable. Unenhanced kidneys
shows no nephrolithiasis. No hydronephrosis or hydroureter.

There is no aortic aneurysm. No calcified ureteral calculi are
noted. No small bowel obstruction. Amount scattered nonspecific
mesenteric lymph nodes are noted the largest in right lower quadrant
mesentery measures 8 mm not pathologic by size criteria.

The terminal ileum is unremarkable. No pericecal inflammation. The
urinary bladder is unremarkable. Some stool noted in rectosigmoid
colon. Prostate gland and seminal vesicles are unremarkable. No
ascites or free air.
IMPRESSION: 1. Limited study without IV or oral contrast. No small bowel or
colonic obstruction. No ascites or free air. Nonspecific mesenteric
lymph nodes are not pathologic by size criteria.
2. No hydronephrosis or hydroureter. No nephrolithiasis. No
calcified ureteral calculi.
3. No calcified calculi are noted within urinary bladder.

## 2017-12-30 ENCOUNTER — Other Ambulatory Visit: Payer: Self-pay

## 2017-12-30 ENCOUNTER — Encounter (HOSPITAL_BASED_OUTPATIENT_CLINIC_OR_DEPARTMENT_OTHER): Payer: Self-pay | Admitting: Emergency Medicine

## 2017-12-30 ENCOUNTER — Emergency Department (HOSPITAL_BASED_OUTPATIENT_CLINIC_OR_DEPARTMENT_OTHER)
Admission: EM | Admit: 2017-12-30 | Discharge: 2017-12-30 | Disposition: A | Discharge status: Other Acute Inpatient Hospital | Payer: Medicaid Other | Attending: Emergency Medicine | Admitting: Emergency Medicine

## 2017-12-30 DIAGNOSIS — J45909 Unspecified asthma, uncomplicated: Secondary | ICD-10-CM | POA: Diagnosis not present

## 2017-12-30 DIAGNOSIS — J95830 Postprocedural hemorrhage and hematoma of a respiratory system organ or structure following a respiratory system procedure: Secondary | ICD-10-CM | POA: Diagnosis not present

## 2017-12-30 DIAGNOSIS — Z79899 Other long term (current) drug therapy: Secondary | ICD-10-CM | POA: Diagnosis not present

## 2017-12-30 DIAGNOSIS — R531 Weakness: Secondary | ICD-10-CM | POA: Diagnosis present

## 2017-12-30 LAB — BASIC METABOLIC PANEL
Anion gap: 10 (ref 5–15)
BUN: 12 mg/dL (ref 6–20)
CHLORIDE: 104 mmol/L (ref 98–111)
CO2: 25 mmol/L (ref 22–32)
Calcium: 8.6 mg/dL — ABNORMAL LOW (ref 8.9–10.3)
Creatinine, Ser: 0.92 mg/dL (ref 0.61–1.24)
GFR calc Af Amer: >60 mL/min (ref >60)
GFR calc non Af Amer: >60 mL/min (ref >60)
Glucose, Bld: 93 mg/dL (ref 70–99)
POTASSIUM: 3.5 mmol/L (ref 3.5–5.1)
SODIUM: 139 mmol/L (ref 135–145)

## 2017-12-30 LAB — CBC WITH DIFFERENTIAL/PLATELET
BASOS ABS: 0.1 10*3/uL (ref 0.0–0.1)
BASOS PCT: 0 %
Eosinophils Absolute: 0.2 10*3/uL (ref 0.0–0.7)
Eosinophils Relative: 1 %
HCT: 52.1 % — ABNORMAL HIGH (ref 39.0–52.0)
Hemoglobin: 18.1 g/dL — ABNORMAL HIGH (ref 13.0–17.0)
Lymphocytes Relative: 16 %
Lymphs Abs: 1.9 10*3/uL (ref 0.7–4.0)
MCH: 27.9 pg (ref 26.0–34.0)
MCHC: 34.7 g/dL (ref 30.0–36.0)
MCV: 80.4 fL (ref 78.0–100.0)
Monocytes Absolute: 1.2 10*3/uL — ABNORMAL HIGH (ref 0.1–1.0)
Monocytes Relative: 10 %
Neutro Abs: 8.3 10*3/uL — ABNORMAL HIGH (ref 1.7–7.7)
Neutrophils Relative %: 73 %
Platelets: 251 10*3/uL (ref 150–400)
RBC: 6.48 MIL/uL — ABNORMAL HIGH (ref 4.22–5.81)
RDW: 13.8 % (ref 11.5–15.5)
WBC: 11.6 10*3/uL — ABNORMAL HIGH (ref 4.0–10.5)

## 2017-12-30 LAB — I-STAT CG4 LACTIC ACID, ED: Lactic Acid, Venous: 0.82 mmol/L (ref 0.5–1.9)

## 2017-12-30 MED ORDER — SODIUM CHLORIDE 0.9 % IV BOLUS
1000.0000 mL | Freq: Once | INTRAVENOUS | Status: AC
  Administered 2017-12-30: 1000 mL via INTRAVENOUS

## 2017-12-30 MED ORDER — BENZOCAINE 20 % MT AERO
INHALATION_SPRAY | Freq: Once | OROMUCOSAL | Status: AC
  Administered 2017-12-30: 1 via OROMUCOSAL
  Filled 2017-12-30: qty 57

## 2017-12-30 MED ORDER — SODIUM CHLORIDE 0.9 % IV BOLUS
500.0000 mL | Freq: Once | INTRAVENOUS | Status: AC
  Administered 2017-12-30: 500 mL via INTRAVENOUS

## 2017-12-30 MED ORDER — DEXAMETHASONE SODIUM PHOSPHATE 10 MG/ML IJ SOLN
12.0000 mg | Freq: Once | INTRAMUSCULAR | Status: AC
  Administered 2017-12-30: 12 mg via INTRAVENOUS
  Filled 2017-12-30: qty 2

## 2017-12-30 NOTE — ED Notes (Signed)
Pt brought in by mom with c/o decrease PO intake of food and fluids. Pt reports that he does not have an appetite.

## 2017-12-30 NOTE — ED Notes (Addendum)
Mom reported that pt coughed and then began spitting out blood. Upon assessment, blood noted to be erupting out of mouth with some large clots noted. ED Provider to bedside to evaluate, feels it was from the pt sucking on the popsicle. Bleeding slowing at this time.

## 2017-12-30 NOTE — ED Provider Notes (Signed)
[facility] EMERGENCY DEPARTMENT Provider Note   CSN: [id number] Arrival date & time: 12/30/17  1310     History   Chief Complaint Chief Complaint  Patient presents with  . Weakness    HPI Fernando Rowe is a 27 y.o. 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 Christell ConstantMoore of ENT on 7/2 who presents the emergency department today for concerns of dehydration.  Mother provides history.  She states the child had tonsillectomy and adenoidectomy at the age of 3 however his right tonsil grew back.  He has been dealing with problems in both years as well as his tonsil over the last several years.  They made the decision to have a tonsillectomy as well as tympanostomy tube placement that was done on 12/26/2017 with success.  She notes that since that time the child has had decreased oral as well as liquid intake.  She notes that he is able to have some clear liquids as well as applesauce but otherwise has not been eating.  She notes that he has been sleeping a lot more especially after giving pain medication, hydrocodone.  She reports he has had decreased urine output with the last output earlier this morning.  The patient notes that he has no pain except for at the site of surgery.  He denies any headache, fever, neck stiffness, ear drainage, difficulty swallowing, bleeding from surgical site, hemoptysis, hematemesis, chest pain, shortness of breath, cough, abdominal pain, nausea/vomiting/diarrhea.  He reports last bowel movement was today and normal.  He denies any urinary symptoms.  HPI  Past Medical History:  Diagnosis Date  . Asthma   . Developmental delay   . Hearing loss   . Hurler syndrome (HCC)     There are no active problems to display for this patient.   Past Surgical History:  Procedure Laterality Date  . ADENOIDECTOMY    . APPENDECTOMY    . MASS EXCISION    . TONSILLECTOMY    .  TYMPANOSTOMY TUBE PLACEMENT          Home Medications    Prior to Admission medications   Medication Sig Start Date End Date Taking? Authorizing Provider  HYDROcodone-acetaminophen (HYCET) 7.5-325 mg/15 ml solution Take 10 mLs by mouth 4 (four) times daily as needed for moderate pain.   Yes [provider]  albuterol-ipratropium (COMBIVENT) 18-103 MCG/ACT inhaler Inhale 2 puffs into the lungs every 6 (six) hours as needed for wheezing.    [provider]  ondansetron (ZOFRAN ODT) 4 MG disintegrating tablet Take 1 tablet (4 mg total) by mouth every 8 (eight) hours as needed for nausea or vomiting. 09/16/13   Gerhard MunchLockwood, Robert, MD  ondansetron (ZOFRAN ODT) 4 MG disintegrating tablet Take 1 tablet (4 mg total) by mouth every 8 (eight) hours as needed for nausea or vomiting. 12/12/13   Emilia BeckSzekalski, Kaitlyn, PA-C  ranitidine (ZANTAC) 150 MG capsule Take 150 mg by mouth 2 (two) times daily.    [provider]    Family History No family history on file.  Social History Social History   Tobacco Use  . Smoking status: Never Smoker  . Smokeless tobacco: Never Used  Substance Use Topics  . Alcohol use: No  . Drug use: No     Allergies   Nsaids; Rocephin [ceftriaxone sodium in dextrose]; Codeine; and Penicillins   Review of Systems Review of Systems  All other systems reviewed and are  negative.    Physical Exam Updated Vital Signs BP 133/83 (BP Location: Left Arm)   Pulse 86   Temp 98.4 F (36.9 C) (Oral)   Resp 16   Ht 5\' 6"  (1.676 m)   Wt 72.6 kg (160 lb)   SpO2 97%   BMI 25.82 kg/m   Physical Exam  Constitutional: He appears well-developed and well-nourished.  HENT:  Head: Normocephalic and atraumatic.  Right Ear: External ear and ear canal normal.  Left Ear: External ear and ear canal normal.  Nose: Nose normal.  Mouth/Throat: Uvula is midline, oropharynx is clear and moist and mucous membranes are normal. No tonsillar exudate.    Tympanostomy tubes in place bilaterally without drainage. The patient is somewhat muffled but likely normal secondary to recent surgery.  He is in control of his secretions.  There is no stridor.  Is a midline uvula without edema.  There is no tongue swelling or lip swelling.  No angioedema.  Soft palate rises symmetrically.  There is no trismus.  Patient with normal postop tonsillectomy sites on the right with gray appearance.  No tonsil on left.  There is no bleeding.  No PTA or post pharyngeal swelling/edema.  Mucous membranes are moist.    Eyes: Pupils are equal, round, and reactive to light. Right eye exhibits no discharge. Left eye exhibits no discharge. No scleral icterus.  Neck: Trachea normal. Neck supple. No spinous process tenderness present. No neck rigidity. Normal range of motion present.  No nuchal rigidity or meningismus  Cardiovascular: Normal rate, regular rhythm and intact distal pulses.  No murmur heard. Pulses:      Radial pulses are 2+ on the right side, and 2+ on the left side.       Dorsalis pedis pulses are 2+ on the right side, and 2+ on the left side.       Posterior tibial pulses are 2+ on the right side, and 2+ on the left side.  No lower extremity swelling or edema. Calves symmetric in size bilaterally.  Pulmonary/Chest: Effort normal and breath sounds normal. He exhibits no tenderness.  Abdominal: Soft. Bowel sounds are normal. There is no tenderness. There is no rebound and no guarding.  Musculoskeletal: He exhibits no edema.  Lymphadenopathy:    He has no cervical adenopathy.  Neurological: He is alert.  Skin: Skin is warm and dry. No rash noted. He is not diaphoretic.  Psychiatric: He has a normal mood and affect.  Nursing note and vitals reviewed.    ED Treatments / Results  Labs (all labs ordered are listed, but only abnormal results are displayed) Labs Reviewed  CBC WITH DIFFERENTIAL/PLATELET - Abnormal; Notable for the following components:       Result Value   WBC 11.6 (*)    RBC 6.48 (*)    Hemoglobin 18.1 (*)    HCT 52.1 (*)    Neutro Abs 8.3 (*)    Monocytes Absolute 1.2 (*)    All other components within normal limits  BASIC METABOLIC PANEL - Abnormal; Notable for the following components:   Calcium 8.6 (*)    All other components within normal limits  I-STAT CG4 LACTIC ACID, ED    EKG None  Radiology No results found.  Procedures Procedures (including critical care time) CRITICAL CARE Performed by: Jacinto Halim   Total critical care time: 60 minutes  Critical care time was exclusive of separately billable procedures and treating other patients.  Critical care was necessary to treat  or prevent imminent or life-threatening deterioration.  Critical care was time spent personally by me on the following activities: development of treatment plan with patient and/or surrogate as well as nursing, discussions with consultants, evaluation of patient's response to treatment, examination of patient, obtaining history from patient or surrogate, ordering and performing treatments and interventions, ordering and review of laboratory studies, ordering and review of radiographic studies, pulse oximetry and re-evaluation of patient's condition.   Medications Ordered in ED Medications  sodium chloride 0.9 % bolus 500 mL (500 mLs Intravenous New Bag/Given 12/30/17 1729)  sodium chloride 0.9 % bolus 1,000 mL (0 mLs Intravenous Stopped 12/30/17 1542)  sodium chloride 0.9 % bolus 500 mL (0 mLs Intravenous Stopped 12/30/17 1729)  Benzocaine (HURRCAINE) 20 % mouth spray (1 application Mouth/Throat Given by Other 12/30/17 1629)  dexamethasone (DECADRON) injection 12 mg (12 mg Intravenous Given 12/30/17 1723)     Initial Impression / Assessment and Plan / ED Course  I have reviewed the triage vital signs and the nursing notes.  Pertinent labs & imaging results that were available during my care of the patient were reviewed by me and  considered in my medical decision making (see chart for details).     27 y.o. 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 Christell Constant of ENT on 7/2 who presents the emergency department today for concerns of dehydration.  Mother reports he has had decreased p.o. intake including solids and liquids.  He is only tolerating applesauce and clear liquids.  He has had decreased urine output with last earlier this morning.  Patient denies any chest pain, shortness of breath, abdominal pain, nausea/vomiting, bleeding from surgical site, hematemesis or hemoptysis.  Vital signs are reassuring on presentation.  There is no fever, tachycardia, tachypnea, hypoxia or hypotension.  Will give IV fluids, p.o. challenge and check basic labs.  Lab work is reassuring.  No anemia.  No lactic acidosis.  No significant electrolyte derangements.  No acute kidney injury.  Patient was unfortunately given cherry popsicle by staff and then patient coughed.  Patient started bleeding after this.  He was given ice water gargles that helped decrease bleeding but is consistently going on for approximately 30 minutes.  Benzocaine spray given without any relief.  We do not have TXA in the department.   Vital signs have remained stable.  Additional IV fluids were given.  I spoke with Dr. Christell Constant directly who recommended 20-30 minutes of ice water gargles.  If bleeding is not stopped he recommends transfer to [facility] for evalution and possible surgery.  5:43 PM Patient still bleeding. Vital signs have remained stable. No hypotension or tachycardia.  Dr. Christell Constant who recommended transfer to [facility] emergency department and have them page when he arrives with plan for evaluation and possible OR.  6:00 PM Spoke with Dr. Jackelyn Hoehn of [facility] ED who will accept transfer. Family in agreement with plan. Dr. Jackelyn Hoehn made aware to call Dr. Christell Constant when  patient arrives and was given direct phone number for Dr. Christell Constant.   6:11 PM EMTALA completed and patient evaluated prior to transfer.  6:30 PM Spoke with [employer]omatic Services operational officer.  Care link is on their way.   6:50 PM Patient transferred.  Patient case seen and discussed with Dr. Madilyn Hook who is in agreement with plan.   Final Clinical Impressions(s) / ED Diagnoses   Final diagnoses:  Hemorrhage following tonsillectomy    ED  Discharge Orders    None       Princella Pellegrini 12/30/17 1851    Tilden Fossa, MD 12/31/17 1556

## 2017-12-30 NOTE — ED Notes (Signed)
Given  popsicle

## 2017-12-30 NOTE — ED Notes (Signed)
Pt on auto VS  

## 2017-12-30 NOTE — ED Triage Notes (Signed)
Pt had tonsillectomy on Tuesday. Family reports decrease in PO intake and urine output. Pt reports generalized weakness.

## 2018-03-23 ENCOUNTER — Other Ambulatory Visit: Payer: Self-pay

## 2018-03-23 ENCOUNTER — Encounter (HOSPITAL_BASED_OUTPATIENT_CLINIC_OR_DEPARTMENT_OTHER): Payer: Self-pay | Admitting: *Deleted

## 2018-03-23 ENCOUNTER — Emergency Department (HOSPITAL_BASED_OUTPATIENT_CLINIC_OR_DEPARTMENT_OTHER)
Admission: EM | Admit: 2018-03-23 | Discharge: 2018-03-23 | Disposition: A | Discharge status: Home / Self Care | Payer: Medicaid Other | Attending: Emergency Medicine | Admitting: Emergency Medicine

## 2018-03-23 DIAGNOSIS — J45909 Unspecified asthma, uncomplicated: Secondary | ICD-10-CM | POA: Diagnosis not present

## 2018-03-23 DIAGNOSIS — H6690 Otitis media, unspecified, unspecified ear: Secondary | ICD-10-CM | POA: Insufficient documentation

## 2018-03-23 DIAGNOSIS — Z79899 Other long term (current) drug therapy: Secondary | ICD-10-CM | POA: Insufficient documentation

## 2018-03-23 DIAGNOSIS — H9203 Otalgia, bilateral: Secondary | ICD-10-CM | POA: Diagnosis present

## 2018-03-23 MED ORDER — AZITHROMYCIN 250 MG PO TABS: ORAL_TABLET | ORAL | 0 refills | Status: AC

## 2018-03-23 NOTE — ED Provider Notes (Signed)
[facility] EMERGENCY DEPARTMENT Provider Note   CSN: [id number] Arrival date & time: 03/23/18  1641     History   Chief Complaint Chief Complaint  Patient presents with  . Otalgia    HPI Fernando Rowe is a 27 y.o. male.  The history is provided by the patient. No language interpreter was used.  Otalgia  This is a new problem. The current episode started more than 2 days ago. There is pain in both ears. The problem occurs constantly. The problem has not changed since onset.There has been no fever. The pain is moderate. Associated symptoms include hearing loss. Pertinent negatives include no ear discharge. His past medical history is significant for chronic ear infection, hearing loss and tympanostomy tube.    Past Medical History:  Diagnosis Date  . Asthma   . Developmental delay   . Hearing loss   . Hurler syndrome (HCC)     There are no active problems to display for this patient.   Past Surgical History:  Procedure Laterality Date  . ADENOIDECTOMY    . APPENDECTOMY    . MASS EXCISION    . TONSILLECTOMY    . TYMPANOSTOMY TUBE PLACEMENT          Home Medications    Prior to Admission medications   Medication Sig Start Date End Date Taking? Authorizing Provider  omeprazole (PRILOSEC) 40 MG capsule Take 40 mg by mouth daily.   Yes [provider]  albuterol-ipratropium (COMBIVENT) 18-103 MCG/ACT inhaler Inhale 2 puffs into the lungs every 6 (six) hours as needed for wheezing.    [provider]  azithromycin (ZITHROMAX Z-PAK) 250 MG tablet As directed 03/23/18   Elson Areas, PA-C  ranitidine (ZANTAC) 150 MG capsule Take 150 mg by mouth 2 (two) times daily.    [provider]    Family History History reviewed. No pertinent family history.  Social History Social History   Tobacco Use  . Smoking status: Never Smoker  . Smokeless tobacco: Never Used  Substance Use Topics  . Alcohol use: No  . Drug use: No      Allergies   Nsaids; Rocephin [ceftriaxone sodium in dextrose]; Codeine; and Penicillins   Review of Systems Review of Systems  HENT: Positive for ear pain and hearing loss. Negative for ear discharge.   All other systems reviewed and are negative.    Physical Exam Updated Vital Signs BP 120/79   Pulse 89   Temp 98.3 F (36.8 C)   Resp 16   Ht 5\' 5"  (1.651 m)   Wt 72 kg   SpO2 99%   BMI 26.41 kg/m   Physical Exam  Constitutional: He appears well-developed and well-nourished.  HENT:  Head: Normocephalic.  Nose: Nose normal.  Mouth/Throat: Oropharynx is clear and moist.  Tubes bilat tm's.  Wax removed from right canal,  Tm is dark around tube.    Cardiovascular: Normal rate.  Pulmonary/Chest: Effort normal.  Musculoskeletal: Normal range of motion.  Neurological: He is alert.  Skin: Skin is warm.  Nursing note and vitals reviewed.    ED Treatments / Results  Labs (all labs ordered are listed, but only abnormal results are displayed) Labs Reviewed - No data to display  EKG None  Radiology No results found.  Procedures Procedures (including critical care time)  Medications Ordered in ED Medications - No data to display   Initial Impression / Assessment and Plan / ED Course  I have reviewed the triage vital  signs and the nursing notes.  Pertinent labs & imaging results that were available during my care of the patient were reviewed by me and considered in my medical decision making (see chart for details).     MDM  I will treat with zithromax.  I advised if symptoms persist after treatment see your ENT for recheck.   Final Clinical Impressions(s) / ED Diagnoses   Final diagnoses:  Acute otitis media, unspecified otitis media type    ED Discharge Orders         Ordered    azithromycin (ZITHROMAX Z-PAK) 250 MG tablet     03/23/18 1718        An After Visit Summary was printed and given to the patient.    Elson Areas,  [place] 03/23/18 1726    Little, Ambrose Finland, MD 03/24/18 [id number]

## 2018-03-23 NOTE — ED Triage Notes (Signed)
Pt c/o left ear pain , recent tubes placed in may

## 2018-03-23 NOTE — Discharge Instructions (Signed)
Return if any problems.

## 2018-11-07 MED FILL — AMOXICILLIN 875 MG TABS: 875 | 10 days supply | Qty: 20 | Fill #0

## 2018-11-12 MED FILL — CLOTRIMAZOLE 1 % SOLN: 1 | 5 days supply | Qty: 10 | Fill #0

## 2018-11-12 MED FILL — CIPRODEX OTIC SUSPENSION: 0.3-0.1 | 9 days supply | Qty: 8 | Fill #0

## 2018-12-24 MED FILL — FLUTICASONE PROP 50 MCG SPR: 50 | 30 days supply | Qty: 16 | Fill #0

## 2019-04-19 MED FILL — NEO/POLYMYXIN/HC EAR SUSP: 3.5-10000-1 | 17 days supply | Qty: 10 | Fill #0

## 2019-04-19 MED FILL — ALBUTEROL SULFATE HFA 108 (: 108 (90 BAS | 17 days supply | Qty: 9 | Fill #0

## 2019-04-19 MED FILL — DOXYCYCLINE HYCLATE 100 MG: 100 | 7 days supply | Qty: 14 | Fill #0

## 2019-04-19 MED FILL — FLUTICASONE PROP 50 MCG SPR: 50 | 30 days supply | Qty: 16 | Fill #0

## 2019-04-19 MED FILL — OMEPRAZOLE 40 MG CPDR: 40 | 30 days supply | Qty: 60 | Fill #0

## 2019-05-03 MED FILL — CEFDINIR 300 MG CAPSULE: 300 | 10 days supply | Qty: 20 | Fill #0

## 2019-05-03 MED FILL — CLARITHROMYCIN 500 MG TAB: 500 | 10 days supply | Qty: 20 | Fill #0

## 2019-08-06 MED FILL — OMEPRAZOLE 40 MG CPDR: 40 | 30 days supply | Qty: 60 | Fill #0

## 2019-09-12 MED FILL — AZITHROMYCIN 250 MG TABLET: 250 | 5 days supply | Qty: 6 | Fill #0

## 2019-09-12 MED FILL — BENZONATATE 200 MG CAPS: 200 | 7 days supply | Qty: 20 | Fill #0

## 2019-10-24 MED FILL — CETIRIZINE HCL 10 MG TABS: 10 | 100 days supply | Qty: 100 | Fill #0

## 2019-10-24 MED FILL — PREDNISONE 10 MG TABS: 10 | 6 days supply | Qty: 21 | Fill #0

## 2019-10-24 MED FILL — SM TUSSIN DM SYRUP: 100-10 | 12 days supply | Qty: 118 | Fill #0

## 2019-10-24 MED FILL — AZITHROMYCIN 250 MG TABLET: 250 | 5 days supply | Qty: 6 | Fill #0

## 2019-10-31 MED FILL — DOXYCYCLINE HYCLATE 100 MG: 100 | 7 days supply | Qty: 14 | Fill #0

## 2019-11-13 MED FILL — CIPRODEX 0.3-0.1 % SUSP: 0.3-0.1 | 9 days supply | Qty: 8 | Fill #0

## 2020-01-31 MED FILL — OMEPRAZOLE 40 MG CPDR: 40 | 30 days supply | Qty: 60 | Fill #1

## 2020-06-01 MED FILL — OMEPRAZOLE 40 MG CPDR: 40 | 30 days supply | Qty: 60 | Fill #2

## 2020-07-10 ENCOUNTER — Other Ambulatory Visit (HOSPITAL_BASED_OUTPATIENT_CLINIC_OR_DEPARTMENT_OTHER): Payer: Self-pay | Admitting: Family Medicine

## 2020-07-10 MED FILL — PROAIR HFA 90 MCG INHALER: 108 (90 BAS | 17 days supply | Qty: 9 | Fill #0

## 2020-07-10 MED FILL — MONTELUKAST SOD 10 MG TAB: 10 | 30 days supply | Qty: 30 | Fill #0

## 2020-08-10 MED FILL — MONTELUKAST SOD 10 MG TAB: 10 | 30 days supply | Qty: 30 | Fill #1

## 2020-08-25 ENCOUNTER — Other Ambulatory Visit (HOSPITAL_BASED_OUTPATIENT_CLINIC_OR_DEPARTMENT_OTHER): Payer: Self-pay | Admitting: Family Medicine

## 2020-08-25 MED FILL — OMEPRAZOLE 40 MG CPDR: 40 | 30 days supply | Qty: 60 | Fill #0

## 2020-09-09 ENCOUNTER — Other Ambulatory Visit (HOSPITAL_BASED_OUTPATIENT_CLINIC_OR_DEPARTMENT_OTHER): Payer: Self-pay | Admitting: Anesthesiology

## 2020-09-28 ENCOUNTER — Other Ambulatory Visit (HOSPITAL_BASED_OUTPATIENT_CLINIC_OR_DEPARTMENT_OTHER): Payer: Self-pay

## 2020-09-28 MED FILL — Omeprazole Cap Delayed Release 40 MG: ORAL | 30 days supply | Qty: 60 | Fill #0 | Status: AC

## 2020-10-07 ENCOUNTER — Other Ambulatory Visit (HOSPITAL_BASED_OUTPATIENT_CLINIC_OR_DEPARTMENT_OTHER): Payer: Self-pay

## 2020-10-07 MED FILL — Montelukast Sodium Tab 10 MG (Base Equiv): ORAL | 30 days supply | Qty: 30 | Fill #0 | Status: AC

## 2020-11-04 ENCOUNTER — Other Ambulatory Visit (HOSPITAL_BASED_OUTPATIENT_CLINIC_OR_DEPARTMENT_OTHER): Payer: Self-pay

## 2020-11-06 ENCOUNTER — Other Ambulatory Visit (HOSPITAL_BASED_OUTPATIENT_CLINIC_OR_DEPARTMENT_OTHER): Payer: Self-pay

## 2020-11-11 ENCOUNTER — Other Ambulatory Visit (HOSPITAL_BASED_OUTPATIENT_CLINIC_OR_DEPARTMENT_OTHER): Payer: Self-pay

## 2020-11-11 MED ORDER — MONTELUKAST SODIUM 10 MG PO TABS
1.0000 | ORAL_TABLET | Freq: Every day | ORAL | 3 refills | Status: AC
  Filled 2020-11-11: qty 30, 30d supply, fill #0
  Filled 2021-01-21: qty 30, 30d supply, fill #1
  Filled 2021-03-04: qty 30, 30d supply, fill #2
  Filled 2021-09-16: qty 30, 30d supply, fill #3

## 2020-11-11 MED ORDER — OMEPRAZOLE 40 MG PO CPDR
DELAYED_RELEASE_CAPSULE | ORAL | 1 refills | Status: DC
  Filled 2020-11-11: qty 60, 30d supply, fill #0
  Filled 2020-12-16: qty 60, 30d supply, fill #1

## 2020-12-16 ENCOUNTER — Other Ambulatory Visit (HOSPITAL_BASED_OUTPATIENT_CLINIC_OR_DEPARTMENT_OTHER): Payer: Self-pay

## 2021-01-21 ENCOUNTER — Other Ambulatory Visit (HOSPITAL_BASED_OUTPATIENT_CLINIC_OR_DEPARTMENT_OTHER): Payer: Self-pay

## 2021-02-19 ENCOUNTER — Other Ambulatory Visit (HOSPITAL_BASED_OUTPATIENT_CLINIC_OR_DEPARTMENT_OTHER): Payer: Self-pay

## 2021-02-19 MED ORDER — OMEPRAZOLE 40 MG PO CPDR
DELAYED_RELEASE_CAPSULE | ORAL | 1 refills | Status: DC
  Filled 2021-02-19: qty 60, 30d supply, fill #0
  Filled 2021-04-01: qty 60, 30d supply, fill #1

## 2021-03-04 ENCOUNTER — Other Ambulatory Visit (HOSPITAL_BASED_OUTPATIENT_CLINIC_OR_DEPARTMENT_OTHER): Payer: Self-pay

## 2021-04-01 ENCOUNTER — Other Ambulatory Visit (HOSPITAL_BASED_OUTPATIENT_CLINIC_OR_DEPARTMENT_OTHER): Payer: Self-pay

## 2021-05-05 ENCOUNTER — Other Ambulatory Visit (HOSPITAL_BASED_OUTPATIENT_CLINIC_OR_DEPARTMENT_OTHER): Payer: Self-pay

## 2021-05-05 MED ORDER — OMEPRAZOLE 40 MG PO CPDR
40.0000 mg | DELAYED_RELEASE_CAPSULE | Freq: Every day | ORAL | 3 refills | Status: AC
  Filled 2021-05-05: qty 90, 90d supply, fill #0
  Filled 2021-09-16: qty 90, 90d supply, fill #1
  Filled 2022-04-28: qty 90, 90d supply, fill #2

## 2021-05-05 MED ORDER — TRAZODONE HCL 50 MG PO TABS
ORAL_TABLET | ORAL | 2 refills | Status: AC
  Filled 2021-05-05: qty 90, 32d supply, fill #0
  Filled 2021-11-01: qty 90, 32d supply, fill #1

## 2021-05-05 MED ORDER — MONTELUKAST SODIUM 10 MG PO TABS
10.0000 mg | ORAL_TABLET | Freq: Every day | ORAL | 3 refills | Status: AC
  Filled 2021-05-05: qty 90, 90d supply, fill #0

## 2021-05-25 ENCOUNTER — Other Ambulatory Visit (HOSPITAL_BASED_OUTPATIENT_CLINIC_OR_DEPARTMENT_OTHER): Payer: Self-pay

## 2021-05-25 MED ORDER — PSEUDOEPH-BROMPHEN-DM 30-2-10 MG/5ML PO SYRP
ORAL_SOLUTION | ORAL | 0 refills | Status: AC
  Filled 2021-05-25: qty 118, 10d supply, fill #0

## 2021-05-25 MED ORDER — AZITHROMYCIN 100 MG/5ML PO SUSR
ORAL | 0 refills | Status: AC
  Filled 2021-05-25: qty 30, 5d supply, fill #0

## 2021-05-26 ENCOUNTER — Other Ambulatory Visit (HOSPITAL_BASED_OUTPATIENT_CLINIC_OR_DEPARTMENT_OTHER): Payer: Self-pay

## 2021-06-17 ENCOUNTER — Other Ambulatory Visit (HOSPITAL_BASED_OUTPATIENT_CLINIC_OR_DEPARTMENT_OTHER): Payer: Self-pay

## 2021-06-17 MED ORDER — NEOMYCIN-POLYMYXIN-HC 3.5-10000-1 OT SUSP
OTIC | 1 refills | Status: AC
  Filled 2021-06-17: qty 10, 50d supply, fill #0
  Filled 2021-09-16: qty 10, 50d supply, fill #1

## 2021-06-18 ENCOUNTER — Other Ambulatory Visit (HOSPITAL_BASED_OUTPATIENT_CLINIC_OR_DEPARTMENT_OTHER): Payer: Self-pay

## 2021-07-29 ENCOUNTER — Other Ambulatory Visit (HOSPITAL_BASED_OUTPATIENT_CLINIC_OR_DEPARTMENT_OTHER): Payer: Self-pay

## 2021-07-29 MED ORDER — CLOTRIMAZOLE 1 % EX SOLN
CUTANEOUS | 1 refills | Status: AC
  Filled 2021-07-29: qty 10, 7d supply, fill #0

## 2021-07-30 ENCOUNTER — Other Ambulatory Visit (HOSPITAL_BASED_OUTPATIENT_CLINIC_OR_DEPARTMENT_OTHER): Payer: Self-pay

## 2021-08-02 ENCOUNTER — Other Ambulatory Visit (HOSPITAL_BASED_OUTPATIENT_CLINIC_OR_DEPARTMENT_OTHER): Payer: Self-pay

## 2021-08-02 MED ORDER — MUPIROCIN 2 % EX OINT
TOPICAL_OINTMENT | CUTANEOUS | 1 refills | Status: AC
  Filled 2021-08-02: qty 22, 10d supply, fill #0
  Filled 2021-09-16: qty 22, 10d supply, fill #1

## 2021-08-02 MED ORDER — DOXYCYCLINE HYCLATE 100 MG PO CAPS
ORAL_CAPSULE | ORAL | 0 refills | Status: DC
  Filled 2021-08-02: qty 20, 10d supply, fill #0

## 2021-08-04 ENCOUNTER — Other Ambulatory Visit (HOSPITAL_BASED_OUTPATIENT_CLINIC_OR_DEPARTMENT_OTHER): Payer: Self-pay

## 2021-09-08 ENCOUNTER — Other Ambulatory Visit (HOSPITAL_BASED_OUTPATIENT_CLINIC_OR_DEPARTMENT_OTHER): Payer: Self-pay

## 2021-09-08 MED ORDER — GENTAMICIN SULFATE 0.3 % OP SOLN
OPHTHALMIC | 2 refills | Status: AC
  Filled 2021-09-08: qty 15, 7d supply, fill #0

## 2021-09-08 MED ORDER — CLOTRIMAZOLE 1 % EX SOLN
4.0000 "application " | Freq: Every day | CUTANEOUS | 1 refills | Status: AC
  Filled 2021-09-08: qty 10, 2d supply, fill #0

## 2021-09-09 ENCOUNTER — Other Ambulatory Visit (HOSPITAL_BASED_OUTPATIENT_CLINIC_OR_DEPARTMENT_OTHER): Payer: Self-pay

## 2021-09-16 ENCOUNTER — Other Ambulatory Visit (HOSPITAL_BASED_OUTPATIENT_CLINIC_OR_DEPARTMENT_OTHER): Payer: Self-pay

## 2021-09-20 ENCOUNTER — Other Ambulatory Visit (HOSPITAL_BASED_OUTPATIENT_CLINIC_OR_DEPARTMENT_OTHER): Payer: Self-pay

## 2021-09-20 MED ORDER — CLINDAMYCIN HCL 300 MG PO CAPS
ORAL_CAPSULE | ORAL | 0 refills | Status: DC
  Filled 2021-09-20: qty 40, 10d supply, fill #0

## 2021-10-25 ENCOUNTER — Other Ambulatory Visit (HOSPITAL_BASED_OUTPATIENT_CLINIC_OR_DEPARTMENT_OTHER): Payer: Self-pay

## 2021-10-25 MED ORDER — CHLORHEXIDINE GLUCONATE 0.12 % MT SOLN
OROMUCOSAL | 0 refills | Status: AC
  Filled 2021-10-25: qty 473, 14d supply, fill #0

## 2021-10-25 MED ORDER — CLINDAMYCIN HCL 300 MG PO CAPS
ORAL_CAPSULE | ORAL | 0 refills | Status: AC
  Filled 2021-10-25: qty 56, 14d supply, fill #0

## 2021-11-02 ENCOUNTER — Other Ambulatory Visit (HOSPITAL_BASED_OUTPATIENT_CLINIC_OR_DEPARTMENT_OTHER): Payer: Self-pay

## 2022-02-09 ENCOUNTER — Other Ambulatory Visit (HOSPITAL_BASED_OUTPATIENT_CLINIC_OR_DEPARTMENT_OTHER): Payer: Self-pay

## 2022-02-09 MED ORDER — CIPROFLOXACIN HCL 500 MG PO TABS
500.0000 mg | ORAL_TABLET | Freq: Two times a day (BID) | ORAL | 0 refills | Status: AC
  Filled 2022-02-09: qty 14, 7d supply, fill #0

## 2022-02-09 MED ORDER — PREDNISONE 20 MG PO TABS
20.0000 mg | ORAL_TABLET | Freq: Every day | ORAL | 0 refills | Status: DC
  Filled 2022-02-09: qty 5, 5d supply, fill #0

## 2022-03-03 ENCOUNTER — Other Ambulatory Visit (HOSPITAL_BASED_OUTPATIENT_CLINIC_OR_DEPARTMENT_OTHER): Payer: Self-pay

## 2022-03-03 MED ORDER — DOXYCYCLINE HYCLATE 100 MG PO TABS
100.0000 mg | ORAL_TABLET | Freq: Two times a day (BID) | ORAL | 0 refills | Status: DC
  Filled 2022-03-03: qty 20, 10d supply, fill #0

## 2022-03-03 MED ORDER — PREDNISONE 20 MG PO TABS
20.0000 mg | ORAL_TABLET | Freq: Every day | ORAL | 0 refills | Status: AC
  Filled 2022-03-03: qty 5, 5d supply, fill #0

## 2022-03-15 ENCOUNTER — Other Ambulatory Visit (HOSPITAL_BASED_OUTPATIENT_CLINIC_OR_DEPARTMENT_OTHER): Payer: Self-pay

## 2022-03-15 MED ORDER — LINEZOLID 600 MG PO TABS
ORAL_TABLET | ORAL | 0 refills | Status: AC
  Filled 2022-03-15: qty 28, 14d supply, fill #0

## 2022-03-16 ENCOUNTER — Other Ambulatory Visit (HOSPITAL_BASED_OUTPATIENT_CLINIC_OR_DEPARTMENT_OTHER): Payer: Self-pay

## 2022-04-28 ENCOUNTER — Other Ambulatory Visit (HOSPITAL_BASED_OUTPATIENT_CLINIC_OR_DEPARTMENT_OTHER): Payer: Self-pay

## 2022-05-28 ENCOUNTER — Emergency Department (HOSPITAL_BASED_OUTPATIENT_CLINIC_OR_DEPARTMENT_OTHER)
Admission: EM | Admit: 2022-05-28 | Discharge: 2022-05-28 | Disposition: A | Discharge status: Home / Self Care | Payer: Medicaid Other | Attending: Emergency Medicine | Admitting: Emergency Medicine

## 2022-05-28 ENCOUNTER — Other Ambulatory Visit: Payer: Self-pay

## 2022-05-28 ENCOUNTER — Encounter (HOSPITAL_BASED_OUTPATIENT_CLINIC_OR_DEPARTMENT_OTHER): Payer: Self-pay | Admitting: Emergency Medicine

## 2022-05-28 DIAGNOSIS — H6123 Impacted cerumen, bilateral: Secondary | ICD-10-CM | POA: Diagnosis present

## 2022-05-28 DIAGNOSIS — R21 Rash and other nonspecific skin eruption: Secondary | ICD-10-CM

## 2022-05-28 DIAGNOSIS — H66006 Acute suppurative otitis media without spontaneous rupture of ear drum, recurrent, bilateral: Secondary | ICD-10-CM | POA: Insufficient documentation

## 2022-05-28 DIAGNOSIS — L219 Seborrheic dermatitis, unspecified: Secondary | ICD-10-CM | POA: Diagnosis not present

## 2022-05-28 DIAGNOSIS — H6093 Unspecified otitis externa, bilateral: Secondary | ICD-10-CM | POA: Insufficient documentation

## 2022-05-28 DIAGNOSIS — J45909 Unspecified asthma, uncomplicated: Secondary | ICD-10-CM | POA: Diagnosis not present

## 2022-05-28 DIAGNOSIS — H9202 Otalgia, left ear: Secondary | ICD-10-CM

## 2022-05-28 MED ORDER — DOXYCYCLINE HYCLATE 100 MG PO CAPS: 100.0000 mg | ORAL_CAPSULE | Freq: Two times a day (BID) | ORAL | 0 refills | Status: AC

## 2022-05-28 MED ORDER — KETOCONAZOLE 2 % EX SHAM: 1.0000 | MEDICATED_SHAMPOO | CUTANEOUS | 0 refills | Status: AC

## 2022-05-28 NOTE — ED Notes (Signed)
Writer irrigated ears bilaterally

## 2022-05-28 NOTE — ED Triage Notes (Signed)
Pt arrives pov with mother, c/o LT ear pain since Thanksgiving. Denies fever

## 2022-05-28 NOTE — ED Provider Notes (Signed)
[facility] EMERGENCY DEPARTMENT Provider Note   CSN: [id number] Arrival date & time: 05/28/22  1444     History {Add pertinent medical, surgical, social history, OB history to HPI:1} No chief complaint on file.   Fernando Rowe is a 31 y.o. male.  HPI      31 year old male with a history of asthma, developmental delay, hearing loss, eustachian tube dysfunction, autism, who presents with concern for left ear pain for greater than one week.   Has seen ENT Dr. 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  This CT was completed October 18.  Was previously on zosyn for otitis externa and had left ear pressure and hearing loss not improved on abx.     Past Medical History:  Diagnosis Date   Asthma    Developmental delay    Hearing loss    Hurler syndrome (HCC)     Home Medications Prior to Admission medications   Medication Sig Start Date End Date Taking? Authorizing Provider  ketoconazole (NIZORAL) 2 % shampoo Apply 1 Application topically 2 (two) times a week. Leave shampoo on for 3-5 minutes prior to rinsing off. Use 2-3 times a week for 2-4 weeks. 05/30/22  Yes Alvira Monday, MD  albuterol (VENTOLIN HFA) 108 (90 Base) MCG/ACT inhaler INHALE 2 PUFFS INTO THE LUNGS EVERY 4 HOURS 07/10/20 07/10/21  Tarri Fuller, MD  albuterol-ipratropium (COMBIVENT) 18-103 MCG/ACT inhaler Inhale 2 puffs into the lungs every 6  (six) hours as needed for wheezing.    [provider]  azithromycin (ZITHROMAX Z-PAK) 250 MG tablet As directed 03/23/18   Elson Areas, PA-C  azithromycin [facility]) 100 MG/5ML suspension Take 10 mls by mouth on day 1 then take 5 mls on days 2 - 5 05/25/21     brompheniramine-pseudoephedrine-DM 30-2-10 MG/5ML syrup Take 5 mLs by mouth every 4 hours for 10 days. 05/25/21     chlorhexidine (PERIDEX) 0.12 % solution RINSE MOUTH WITH (1 CAPFUL) FOR 30 SECONDS AM AND PM AFTER TOOTHBRUSHING. EXPECTORATE AFTER RINSING, DO NOT SWALLOW 10/25/21     ciprofloxacin (CIPRO) 500 MG tablet Take 1 tablet (500 mg total) by mouth 2 (two) times daily for 7 days. 02/09/22     clindamycin (CLEOCIN) 300 MG capsule Take 1 capsule by mouth every 6 hours 10/25/21     clotrimazole (LOTRIMIN) 1 % external solution Apply 4 drops into each ear twice a day for seven days. 07/29/21     clotrimazole (LOTRIMIN) 1 % external solution Apply 4 drops into affected ear daily for 7 days. 09/08/21     doxycycline (VIBRA-TABS) 100 MG tablet Take 1 tablet (100 mg total) by mouth 2 (two) times daily for 10 days. 03/03/22     doxycycline (VIBRAMYCIN) 100 MG capsule Take 1 capsule (100 mg total) by mouth 2 times daily for 10 days. 08/01/21     gentamicin (GARAMYCIN) 0.3 % ophthalmic solution Place 4 drops in ear(s) 2 times daily for 7 days. 09/08/21     linezolid (ZYVOX) 600  MG tablet Take 1 tablet (600 mg total) by mouth 2 times daily for 14 days. 03/15/22     montelukast (SINGULAIR) 10 MG tablet Take 1 tablet (10 mg total) by mouth daily. 11/11/20     montelukast (SINGULAIR) 10 MG tablet Take 1 tablet (10 mg total) by mouth daily. 05/05/21     mupirocin ointment (BACTROBAN) 2 % Put 1/3 tube in 45 ml saline nasal spray.  Mix well.  Use 4 drops in the affected EAR(s) twice daily for 7 days. 08/01/21     neomycin-polymyxin-hydrocortisone (CORTISPORIN) 3.5-10000-1 OTIC suspension Use 4 drops in the affected ear(s) twice daily for 7 days 06/17/21      omeprazole (PRILOSEC) 40 MG capsule Take 40 mg by mouth daily.    [provider]  omeprazole (PRILOSEC) 40 MG capsule Take 1 capsule (40 mg total) by mouth daily. 05/05/21     predniSONE (DELTASONE) 20 MG tablet Take 1 tablet (20 mg total) by mouth daily for 5 days. 03/03/22     ranitidine (ZANTAC) 150 MG capsule Take 150 mg by mouth 2 (two) times daily.    [provider]  traZODone (DESYREL) 50 MG tablet Take 1 tablet by mouth nightly for 3 to 4 nights and may increase 2 to 3 tablets nightly for sleep 05/05/21         Allergies    Nsaids, Rocephin [ceftriaxone sodium in dextrose], Codeine, and Penicillins    Review of Systems   Review of Systems  Physical Exam Updated Vital Signs BP (!) 114/92   Pulse (!) 102   Temp 98.2 F (36.8 C) (Oral)   Resp 14   Ht 5\' 5"  (1.651 m)   Wt 72.6 kg   SpO2 99%   BMI 26.63 kg/m  Physical Exam  ED Results / Procedures / Treatments   Labs (all labs ordered are listed, but only abnormal results are displayed) Labs Reviewed - No data to display  EKG None  Radiology No results found.  Procedures Procedures  {Document cardiac monitor, telemetry assessment procedure when appropriate:1}  Medications Ordered in ED Medications - No data to display  ED Course/ Medical Decision Making/ A&P                           Medical Decision Making Risk Prescription drug management.   31 year old male with a history of asthma, developmental delay, hearing loss, eustachian tube dysfunction, autism, who presents with concern for left ear pain for greater than one week.   He has a complicated otitis history with history of eustachian tube dysfunction, recurrent otitis, hearing loss and regularly sees ENT>  He has no fever, headache or systemic symptoms today.  He does have bilateral cerumen impaction.  Recommend close follow up with his ENT. Do not suspect acute mastoiditis, subdural empyema, CVT, sepsis.    Skin exam is most  consistent with seborrheic dermatitis over scalp and over area of beard. Will treat with ketoconazole shampoo 2%.  I see they have contacted PCP for dermatology referral earlier this week.   {Document critical care time when appropriate:1} {Document review of labs and clinical decision tools ie heart score, Chads2Vasc2 etc:1}  {Document your independent review of radiology images, and any outside records:1} {Document your discussion with family members, caretakers, and with consultants:1} {Document social determinants of health affecting pt's care:1} {Document your decision making why or why not admission, treatments were needed:1} Final Clinical Impression(s) / ED Diagnoses Final diagnoses:  None    Rx / DC Orders ED Discharge Orders          Ordered    ketoconazole (NIZORAL) 2 % shampoo  2 times weekly        05/28/22 1631

## 2022-06-09 ENCOUNTER — Other Ambulatory Visit (HOSPITAL_BASED_OUTPATIENT_CLINIC_OR_DEPARTMENT_OTHER): Payer: Self-pay

## 2022-06-09 MED ORDER — FLUCONAZOLE 100 MG PO TABS
100.0000 mg | ORAL_TABLET | Freq: Every day | ORAL | 0 refills | Status: AC
  Filled 2022-06-09: qty 10, 10d supply, fill #0

## 2022-07-20 ENCOUNTER — Other Ambulatory Visit (HOSPITAL_BASED_OUTPATIENT_CLINIC_OR_DEPARTMENT_OTHER): Payer: Self-pay

## 2022-07-22 ENCOUNTER — Other Ambulatory Visit (HOSPITAL_BASED_OUTPATIENT_CLINIC_OR_DEPARTMENT_OTHER): Payer: Self-pay

## 2022-07-27 ENCOUNTER — Other Ambulatory Visit (HOSPITAL_BASED_OUTPATIENT_CLINIC_OR_DEPARTMENT_OTHER): Payer: Self-pay

## 2022-08-10 ENCOUNTER — Other Ambulatory Visit (HOSPITAL_BASED_OUTPATIENT_CLINIC_OR_DEPARTMENT_OTHER): Payer: Self-pay

## 2022-08-11 ENCOUNTER — Other Ambulatory Visit (HOSPITAL_BASED_OUTPATIENT_CLINIC_OR_DEPARTMENT_OTHER): Payer: Self-pay

## 2022-08-11 MED ORDER — OMEPRAZOLE 40 MG PO CPDR
40.0000 mg | DELAYED_RELEASE_CAPSULE | Freq: Every day | ORAL | 0 refills | Status: DC
  Filled 2022-08-11 – 2022-09-02 (×2): qty 90, 90d supply, fill #0

## 2022-08-19 ENCOUNTER — Other Ambulatory Visit (HOSPITAL_BASED_OUTPATIENT_CLINIC_OR_DEPARTMENT_OTHER): Payer: Self-pay

## 2022-09-02 ENCOUNTER — Other Ambulatory Visit (HOSPITAL_BASED_OUTPATIENT_CLINIC_OR_DEPARTMENT_OTHER): Payer: Self-pay

## 2022-10-04 ENCOUNTER — Other Ambulatory Visit (HOSPITAL_BASED_OUTPATIENT_CLINIC_OR_DEPARTMENT_OTHER): Payer: Self-pay

## 2022-10-04 MED ORDER — FLUTICASONE PROPIONATE 50 MCG/ACT NA SUSP
NASAL | 3 refills | Status: AC
  Filled 2022-10-04: qty 16, 30d supply, fill #0
  Filled 2023-05-04: qty 16, 30d supply, fill #1

## 2022-10-04 MED ORDER — ALBUTEROL SULFATE HFA 108 (90 BASE) MCG/ACT IN AERS
INHALATION_SPRAY | RESPIRATORY_TRACT | 3 refills | Status: AC
  Filled 2022-10-04: qty 18, 21d supply, fill #0

## 2022-10-04 MED ORDER — CETIRIZINE HCL 10 MG PO TABS
10.0000 mg | ORAL_TABLET | Freq: Every day | ORAL | 5 refills | Status: AC
  Filled 2022-10-04: qty 30, 30d supply, fill #0

## 2022-10-04 MED ORDER — FLUCONAZOLE 150 MG PO TABS
150.0000 mg | ORAL_TABLET | Freq: Every day | ORAL | 0 refills | Status: AC
  Filled 2022-10-04: qty 14, 14d supply, fill #0

## 2022-12-23 ENCOUNTER — Other Ambulatory Visit (HOSPITAL_BASED_OUTPATIENT_CLINIC_OR_DEPARTMENT_OTHER): Payer: Self-pay

## 2022-12-28 ENCOUNTER — Other Ambulatory Visit (HOSPITAL_BASED_OUTPATIENT_CLINIC_OR_DEPARTMENT_OTHER): Payer: Self-pay

## 2022-12-28 MED ORDER — OMEPRAZOLE 40 MG PO CPDR
40.0000 mg | DELAYED_RELEASE_CAPSULE | Freq: Every day | ORAL | 0 refills | Status: DC
  Filled 2022-12-28: qty 90, 90d supply, fill #0

## 2023-05-04 ENCOUNTER — Other Ambulatory Visit (HOSPITAL_BASED_OUTPATIENT_CLINIC_OR_DEPARTMENT_OTHER): Payer: Self-pay

## 2023-05-05 ENCOUNTER — Other Ambulatory Visit: Payer: Self-pay

## 2023-05-05 ENCOUNTER — Other Ambulatory Visit (HOSPITAL_BASED_OUTPATIENT_CLINIC_OR_DEPARTMENT_OTHER): Payer: Self-pay

## 2023-05-05 MED ORDER — OMEPRAZOLE 40 MG PO CPDR
40.0000 mg | DELAYED_RELEASE_CAPSULE | Freq: Every day | ORAL | 0 refills | Status: DC
  Filled 2023-05-05: qty 90, 90d supply, fill #0

## 2023-05-12 ENCOUNTER — Other Ambulatory Visit (HOSPITAL_BASED_OUTPATIENT_CLINIC_OR_DEPARTMENT_OTHER): Payer: Self-pay

## 2023-05-12 MED ORDER — PSEUDOEPH-BROMPHEN-DM 30-2-10 MG/5ML PO SYRP
10.0000 mL | ORAL_SOLUTION | ORAL | 1 refills | Status: AC
  Filled 2023-05-12: qty 118, 2d supply, fill #0

## 2023-05-12 MED ORDER — FLUCONAZOLE 150 MG PO TABS
150.0000 mg | ORAL_TABLET | Freq: Every day | ORAL | 0 refills | Status: AC
  Filled 2023-05-12: qty 5, 5d supply, fill #0

## 2023-05-12 MED ORDER — DOXYCYCLINE MONOHYDRATE 100 MG PO CAPS
100.0000 mg | ORAL_CAPSULE | Freq: Two times a day (BID) | ORAL | 0 refills | Status: AC
  Filled 2023-05-12: qty 20, 10d supply, fill #0

## 2023-05-12 MED ORDER — CARBAMIDE PEROXIDE 6.5 % OT SOLN
5.0000 [drp] | Freq: Two times a day (BID) | OTIC | 0 refills | Status: AC
  Filled 2023-05-12: qty 15, 15d supply, fill #0

## 2023-05-15 ENCOUNTER — Other Ambulatory Visit (HOSPITAL_BASED_OUTPATIENT_CLINIC_OR_DEPARTMENT_OTHER): Payer: Self-pay

## 2023-05-16 ENCOUNTER — Other Ambulatory Visit (HOSPITAL_BASED_OUTPATIENT_CLINIC_OR_DEPARTMENT_OTHER): Payer: Self-pay

## 2023-05-29 ENCOUNTER — Other Ambulatory Visit (HOSPITAL_BASED_OUTPATIENT_CLINIC_OR_DEPARTMENT_OTHER): Payer: Self-pay

## 2023-09-15 ENCOUNTER — Other Ambulatory Visit (HOSPITAL_BASED_OUTPATIENT_CLINIC_OR_DEPARTMENT_OTHER): Payer: Self-pay

## 2023-09-15 MED ORDER — CLINDAMYCIN HCL 300 MG PO CAPS
600.0000 mg | ORAL_CAPSULE | Freq: Three times a day (TID) | ORAL | 0 refills | Status: AC
  Filled 2023-09-15: qty 30, 5d supply, fill #0

## 2023-10-09 ENCOUNTER — Other Ambulatory Visit (HOSPITAL_BASED_OUTPATIENT_CLINIC_OR_DEPARTMENT_OTHER): Payer: Self-pay

## 2023-10-09 MED ORDER — OMEPRAZOLE 40 MG PO CPDR
40.0000 mg | DELAYED_RELEASE_CAPSULE | Freq: Every day | ORAL | 0 refills | Status: AC
Start: 2023-10-09 — End: ?
  Filled 2023-10-09: qty 90, 90d supply, fill #0

## 2023-10-12 ENCOUNTER — Other Ambulatory Visit (HOSPITAL_BASED_OUTPATIENT_CLINIC_OR_DEPARTMENT_OTHER): Payer: Self-pay
